# Patient Record
Sex: Female | Born: 1965 | Race: White | Hispanic: No | Marital: Married | Smoking: Never smoker
Health system: Southern US, Community
[De-identification: ages and names within clinical notes are randomized; demographics above are authoritative.]

## PROBLEM LIST (undated history)

## (undated) DIAGNOSIS — M1990 Unspecified osteoarthritis, unspecified site: Secondary | ICD-10-CM

## (undated) DIAGNOSIS — M2022 Hallux rigidus, left foot: Secondary | ICD-10-CM

## (undated) DIAGNOSIS — IMO0001 Reserved for inherently not codable concepts without codable children: Secondary | ICD-10-CM

## (undated) DIAGNOSIS — R42 Dizziness and giddiness: Secondary | ICD-10-CM

## (undated) DIAGNOSIS — T8859XA Other complications of anesthesia, initial encounter: Secondary | ICD-10-CM

## (undated) DIAGNOSIS — I10 Essential (primary) hypertension: Secondary | ICD-10-CM

## (undated) DIAGNOSIS — M719 Bursopathy, unspecified: Secondary | ICD-10-CM

## (undated) DIAGNOSIS — IMO0002 Reserved for concepts with insufficient information to code with codable children: Secondary | ICD-10-CM

## (undated) DIAGNOSIS — M5136 Other intervertebral disc degeneration, lumbar region: Secondary | ICD-10-CM

## (undated) DIAGNOSIS — F419 Anxiety disorder, unspecified: Secondary | ICD-10-CM

## (undated) DIAGNOSIS — E039 Hypothyroidism, unspecified: Secondary | ICD-10-CM

## (undated) DIAGNOSIS — J302 Other seasonal allergic rhinitis: Secondary | ICD-10-CM

## (undated) DIAGNOSIS — K219 Gastro-esophageal reflux disease without esophagitis: Secondary | ICD-10-CM

## (undated) DIAGNOSIS — E282 Polycystic ovarian syndrome: Secondary | ICD-10-CM

## (undated) DIAGNOSIS — R7303 Prediabetes: Secondary | ICD-10-CM

## (undated) DIAGNOSIS — E669 Obesity, unspecified: Secondary | ICD-10-CM

## (undated) DIAGNOSIS — M51369 Other intervertebral disc degeneration, lumbar region without mention of lumbar back pain or lower extremity pain: Secondary | ICD-10-CM

## (undated) DIAGNOSIS — T4145XA Adverse effect of unspecified anesthetic, initial encounter: Secondary | ICD-10-CM

## (undated) DIAGNOSIS — T753XXA Motion sickness, initial encounter: Secondary | ICD-10-CM

## (undated) HISTORY — PX: FACIAL COSMETIC SURGERY: SHX629

---

## 1988-02-19 HISTORY — PX: KNEE ARTHROSCOPY: SHX127

## 1996-02-19 HISTORY — PX: DIAGNOSTIC LAPAROSCOPY: SUR761

## 2005-05-06 ENCOUNTER — Ambulatory Visit: Payer: Self-pay | Admitting: Gynecology

## 2005-05-14 ENCOUNTER — Ambulatory Visit: Payer: Self-pay | Admitting: Family Medicine

## 2005-05-28 ENCOUNTER — Ambulatory Visit: Payer: Self-pay | Admitting: Family Medicine

## 2005-06-18 ENCOUNTER — Ambulatory Visit: Payer: Self-pay | Admitting: Family Medicine

## 2005-06-18 ENCOUNTER — Inpatient Hospital Stay (HOSPITAL_COMMUNITY): Admission: AD | Admit: 2005-06-18 | Discharge: 2005-06-18 | Payer: Self-pay | Admitting: Gynecology

## 2005-07-08 ENCOUNTER — Ambulatory Visit: Payer: Self-pay | Admitting: Gynecology

## 2005-11-25 ENCOUNTER — Other Ambulatory Visit: Admission: RE | Admit: 2005-11-25 | Discharge: 2005-11-25 | Payer: Self-pay | Admitting: Gynecology

## 2007-04-09 ENCOUNTER — Ambulatory Visit: Payer: Self-pay | Admitting: Gynecology

## 2007-10-21 ENCOUNTER — Ambulatory Visit: Payer: Self-pay | Admitting: Obstetrics and Gynecology

## 2010-07-03 NOTE — Assessment & Plan Note (Signed)
NAME:  Toni Wright, Toni Wright NO.:  [id number]   MEDICAL RECORD NO.:  [id number]          PATIENT TYPE:  POB   LOCATION:  [facility] at [facility]         FACILITY:  [facility]   PHYSICIAN:  Argentina Donovan, MD        DATE OF BIRTH:  09/01/1965   DATE OF SERVICE:  10/21/2007                                  CLINIC NOTE   The patient is a 45 year old Caucasian female gravida 2, para 1-0-1-1,  who is a long-time polycystic ovarian.  The patient has been on  Glucophage 1500 at bedtime for some time by her internist.  She  previously had been seen by an infertility specialist, who told her that  she needed more than Clomid.  We spent about 35-40 minutes discussing  her problem of infertility.  Her irregular periods, which have been the  most bothersome to her recently, where she will skip a month or two and  then have a very heavy period.  We have gone over in detail the process  of ovulation and discussed the temperature chart as well as what to look  for, how to take that, and how to use Glucophage and Clomid.  We have  changed her Glucophage to 3 times a day with meals at 500 mg nightly.  We are starting on Clomid 50 mg daily, day #5 through #9 of cycle.  If  she goes more than 30 days after the Clomid without a temperature rise  on her temperature chart, she is to come in.  If she does have a period,  then she is to continue this for 3 months, and then come in if she is  not pregnant at that time.  She has also been instructed to make notes  of days that she has coitus, so that we can evaluate the effectiveness  of the medicine and make sure that the correct day was yet.  The patient  weighs 300 pounds.  She has marked hirsutism, especially on the face,  and she has had polycystic ovarian syndrome for long term.  The plan is  as above.  We will have her return as previously noted and with her  temperature chart.   IMPRESSION:  Secondary infertility with polycystic ovarian  syndrome.           ______________________________  Argentina Donovan, MD     PR/MEDQ  D:  10/21/2007  T:  10/22/2007  Job:  [id number]

## 2010-07-06 NOTE — Group Therapy Note (Signed)
NAME:  Toni Wright, DUET NO.:  [id number]   MEDICAL RECORD NO.:  [id number]           PATIENT TYPE:   LOCATION:  [facility]                     FACILITY:   PHYSICIAN:  Tinnie Gens, MD        DATE OF BIRTH:  Nov 10, 1965   DATE OF SERVICE:                                    CLINIC NOTE   REAL TIME SCANNING:  The transvaginal probe was used on this patient.  An  intrauterine gestational sac plus yolk sac plus amnion are noted.  A fetal  pull is noted with a positive flicker.  Crown-rump length is approximately  5.8 mm.  Her gestational age is 6 weeks and 2 days.  The uterus overall size  is 8.3 x 6.7, with the cervix that is not included in that measurement of  3.5 cm.  The patient's right ovary is visualized and measures 2.5 x 1.7 cm.  The patient's left ovary measures 2.4 x 2.4 cm.  There are no other pelvic  abnormalities noted.  The images were saved in the patient's chart.           ______________________________  Tinnie Gens, MD     TP/MEDQ  D:  05/28/2005  T:  05/28/2005  Job:  [id number]

## 2011-06-25 ENCOUNTER — Other Ambulatory Visit: Payer: Self-pay | Admitting: Orthopedic Surgery

## 2011-06-26 ENCOUNTER — Encounter (HOSPITAL_BASED_OUTPATIENT_CLINIC_OR_DEPARTMENT_OTHER): Payer: Self-pay | Admitting: *Deleted

## 2011-06-26 NOTE — Progress Notes (Signed)
Will call [facility] for ekg and notes fro visit for chest pain-was r/o to be reflux Will need istat

## 2011-06-26 NOTE — Progress Notes (Signed)
pts pcp at [facility]

## 2011-06-27 NOTE — Progress Notes (Signed)
[facility] has no notes from pt since 1999 Not sure where pt went to r/o cp-to reflux- For ctr Will be sure anesth ok with that

## 2011-06-27 NOTE — Progress Notes (Signed)
Reviewed w/ dr Volanda Napoleon for ctr

## 2011-06-28 ENCOUNTER — Encounter (HOSPITAL_BASED_OUTPATIENT_CLINIC_OR_DEPARTMENT_OTHER): Payer: Self-pay | Admitting: Certified Registered Nurse Anesthetist

## 2011-06-28 ENCOUNTER — Other Ambulatory Visit: Payer: Self-pay

## 2011-06-28 ENCOUNTER — Encounter (HOSPITAL_BASED_OUTPATIENT_CLINIC_OR_DEPARTMENT_OTHER)
Admission: RE | Disposition: A | Discharge status: Home / Self Care | Payer: Self-pay | Source: Ambulatory Visit | Attending: Orthopedic Surgery

## 2011-06-28 ENCOUNTER — Ambulatory Visit (HOSPITAL_BASED_OUTPATIENT_CLINIC_OR_DEPARTMENT_OTHER): Payer: BC Managed Care – PPO | Admitting: Certified Registered Nurse Anesthetist

## 2011-06-28 ENCOUNTER — Ambulatory Visit (HOSPITAL_BASED_OUTPATIENT_CLINIC_OR_DEPARTMENT_OTHER)
Admission: RE | Admit: 2011-06-28 | Discharge: 2011-06-28 | Disposition: A | Discharge status: Home / Self Care | Payer: BC Managed Care – PPO | Source: Ambulatory Visit | Attending: Orthopedic Surgery | Admitting: Orthopedic Surgery

## 2011-06-28 DIAGNOSIS — E039 Hypothyroidism, unspecified: Secondary | ICD-10-CM | POA: Insufficient documentation

## 2011-06-28 DIAGNOSIS — E119 Type 2 diabetes mellitus without complications: Secondary | ICD-10-CM | POA: Insufficient documentation

## 2011-06-28 DIAGNOSIS — K219 Gastro-esophageal reflux disease without esophagitis: Secondary | ICD-10-CM | POA: Insufficient documentation

## 2011-06-28 DIAGNOSIS — E282 Polycystic ovarian syndrome: Secondary | ICD-10-CM | POA: Insufficient documentation

## 2011-06-28 DIAGNOSIS — M129 Arthropathy, unspecified: Secondary | ICD-10-CM | POA: Insufficient documentation

## 2011-06-28 DIAGNOSIS — G5600 Carpal tunnel syndrome, unspecified upper limb: Secondary | ICD-10-CM | POA: Insufficient documentation

## 2011-06-28 DIAGNOSIS — J45909 Unspecified asthma, uncomplicated: Secondary | ICD-10-CM | POA: Insufficient documentation

## 2011-06-28 HISTORY — DX: Polycystic ovarian syndrome: E28.2

## 2011-06-28 HISTORY — DX: Gastro-esophageal reflux disease without esophagitis: K21.9

## 2011-06-28 HISTORY — DX: Unspecified osteoarthritis, unspecified site: M19.90

## 2011-06-28 HISTORY — DX: Other seasonal allergic rhinitis: J30.2

## 2011-06-28 HISTORY — DX: Hypothyroidism, unspecified: E03.9

## 2011-06-28 HISTORY — PX: CARPAL TUNNEL RELEASE: SHX101

## 2011-06-28 LAB — POCT I-STAT, CHEM 8
BUN: 10 mg/dL (ref 6–23)
Calcium, Ion: 1.09 mmol/L — ABNORMAL LOW (ref 1.12–1.32)
Chloride: 106 mEq/L (ref 96–112)
Glucose, Bld: 121 mg/dL — ABNORMAL HIGH (ref 70–99)
TCO2: 24 mmol/L (ref 0–100)

## 2011-06-28 SURGERY — CARPAL TUNNEL RELEASE
Anesthesia: General | Site: Hand | Laterality: Right | Wound class: Clean

## 2011-06-28 MED ORDER — CHLORHEXIDINE GLUCONATE 4 % EX LIQD: 60.0000 mL | Freq: Once | CUTANEOUS | Status: DC

## 2011-06-28 MED ORDER — PROPOFOL 10 MG/ML IV EMUL
INTRAVENOUS | Status: DC | PRN
  Administered 2011-06-28: 260 mg via INTRAVENOUS

## 2011-06-28 MED ORDER — HYDROMORPHONE HCL PF 1 MG/ML IJ SOLN
0.2500 mg | INTRAMUSCULAR | Status: DC | PRN
  Administered 2011-06-28 (×2): 0.5 mg via INTRAVENOUS

## 2011-06-28 MED ORDER — FENTANYL CITRATE 0.05 MG/ML IJ SOLN: 50.0000 ug | INTRAMUSCULAR | Status: DC | PRN

## 2011-06-28 MED ORDER — MIDAZOLAM HCL 2 MG/2ML IJ SOLN: 1.0000 mg | INTRAMUSCULAR | Status: DC | PRN

## 2011-06-28 MED ORDER — OXYCODONE-ACETAMINOPHEN 5-325 MG PO TABS: 1.0000 | ORAL_TABLET | ORAL | Status: AC | PRN

## 2011-06-28 MED ORDER — FENTANYL CITRATE 0.05 MG/ML IJ SOLN
INTRAMUSCULAR | Status: DC | PRN
  Administered 2011-06-28: 50 ug via INTRAVENOUS

## 2011-06-28 MED ORDER — ONDANSETRON HCL 4 MG/2ML IJ SOLN
INTRAMUSCULAR | Status: DC | PRN
  Administered 2011-06-28: 4 mg via INTRAVENOUS

## 2011-06-28 MED ORDER — LIDOCAINE HCL (CARDIAC) 20 MG/ML IV SOLN
INTRAVENOUS | Status: DC | PRN
  Administered 2011-06-28: 30 mg via INTRAVENOUS

## 2011-06-28 MED ORDER — MIDAZOLAM HCL 5 MG/5ML IJ SOLN
INTRAMUSCULAR | Status: DC | PRN
  Administered 2011-06-28: 2 mg via INTRAVENOUS

## 2011-06-28 MED ORDER — LIDOCAINE HCL 2 % IJ SOLN
INTRAMUSCULAR | Status: DC | PRN
  Administered 2011-06-28: 3.5 mL

## 2011-06-28 MED ORDER — DEXAMETHASONE SODIUM PHOSPHATE 10 MG/ML IJ SOLN
INTRAMUSCULAR | Status: DC | PRN
  Administered 2011-06-28: 4 mg via INTRAVENOUS
  Administered 2011-06-28: 5 mg via INTRAVENOUS

## 2011-06-28 MED ORDER — LORAZEPAM 2 MG/ML IJ SOLN: 1.0000 mg | Freq: Once | INTRAMUSCULAR | Status: DC | PRN

## 2011-06-28 MED ORDER — LACTATED RINGERS IV SOLN
INTRAVENOUS | Status: DC
  Administered 2011-06-28: 09:00:00 via INTRAVENOUS
  Administered 2011-06-28: 20 mL/h via INTRAVENOUS

## 2011-06-28 SURGICAL SUPPLY — 38 items
BANDAGE ADHESIVE 1X3 (GAUZE/BANDAGES/DRESSINGS) IMPLANT
BANDAGE ELASTIC 3 VELCRO ST LF (GAUZE/BANDAGES/DRESSINGS) ×2 IMPLANT
BLADE SURG 15 STRL LF DISP TIS (BLADE) ×1 IMPLANT
BLADE SURG 15 STRL SS (BLADE) ×2
BNDG CMPR 9X4 STRL LF SNTH (GAUZE/BANDAGES/DRESSINGS) ×1
BNDG ESMARK 4X9 LF (GAUZE/BANDAGES/DRESSINGS) ×1 IMPLANT
BRUSH SCRUB EZ PLAIN DRY (MISCELLANEOUS) ×2 IMPLANT
CLOTH BEACON ORANGE TIMEOUT ST (SAFETY) ×2 IMPLANT
CORDS BIPOLAR (ELECTRODE) IMPLANT
COVER MAYO STAND STRL (DRAPES) ×2 IMPLANT
COVER TABLE BACK 60X90 (DRAPES) ×2 IMPLANT
CUFF TOURNIQUET SINGLE 18IN (TOURNIQUET CUFF) IMPLANT
CUFF TOURNIQUET SINGLE 24IN (TOURNIQUET CUFF) ×1 IMPLANT
DECANTER SPIKE VIAL GLASS SM (MISCELLANEOUS) IMPLANT
DRAPE EXTREMITY T 121X128X90 (DRAPE) ×2 IMPLANT
DRAPE SURG 17X23 STRL (DRAPES) ×2 IMPLANT
GLOVE BIO SURGEON STRL SZ 6.5 (GLOVE) ×1 IMPLANT
GLOVE BIOGEL M STRL SZ7.5 (GLOVE) ×2 IMPLANT
GLOVE ORTHO TXT STRL SZ7.5 (GLOVE) ×2 IMPLANT
GOWN PREVENTION PLUS XLARGE (GOWN DISPOSABLE) ×2 IMPLANT
GOWN PREVENTION PLUS XXLARGE (GOWN DISPOSABLE) ×4 IMPLANT
NEEDLE 27GAX1X1/2 (NEEDLE) ×1 IMPLANT
PACK BASIN DAY SURGERY FS (CUSTOM PROCEDURE TRAY) ×2 IMPLANT
PAD CAST 3X4 CTTN HI CHSV (CAST SUPPLIES) ×1 IMPLANT
PADDING CAST ABS 4INX4YD NS (CAST SUPPLIES) ×1
PADDING CAST ABS COTTON 4X4 ST (CAST SUPPLIES) ×1 IMPLANT
PADDING CAST COTTON 3X4 STRL (CAST SUPPLIES) ×2
SPLINT PLASTER CAST XFAST 3X15 (CAST SUPPLIES) ×5 IMPLANT
SPLINT PLASTER XTRA FASTSET 3X (CAST SUPPLIES) ×5
SPONGE GAUZE 4X4 12PLY (GAUZE/BANDAGES/DRESSINGS) ×2 IMPLANT
STOCKINETTE 4X48 STRL (DRAPES) ×2 IMPLANT
STRIP CLOSURE SKIN 1/2X4 (GAUZE/BANDAGES/DRESSINGS) ×2 IMPLANT
SUT PROLENE 3 0 PS 2 (SUTURE) ×2 IMPLANT
SYR 3ML 23GX1 SAFETY (SYRINGE) IMPLANT
SYR CONTROL 10ML LL (SYRINGE) ×1 IMPLANT
TRAY DSU PREP LF (CUSTOM PROCEDURE TRAY) ×2 IMPLANT
UNDERPAD 30X30 INCONTINENT (UNDERPADS AND DIAPERS) ×2 IMPLANT
WATER STERILE IRR 1000ML POUR (IV SOLUTION) ×2 IMPLANT

## 2011-06-28 NOTE — Anesthesia Postprocedure Evaluation (Signed)
  Anesthesia Post-op Note  Patient: Toni Wright  Procedure(s) Performed: Procedure(s) (LRB): CARPAL TUNNEL RELEASE (Right)  Patient Location: PACU  Anesthesia Type: General  Level of Consciousness: awake  Airway and Oxygen Therapy: Patient Spontanous Breathing  Post-op Pain: mild  Post-op Assessment: Post-op Vital signs reviewed, Patient's Cardiovascular Status Stable, Respiratory Function Stable, Patent Airway, No signs of Nausea or vomiting, Adequate PO intake and Pain level controlled  Post-op Vital Signs: stable  Complications: No apparent anesthesia complications

## 2011-06-28 NOTE — Discharge Instructions (Signed)

## 2011-06-28 NOTE — Anesthesia Preprocedure Evaluation (Addendum)
Anesthesia Evaluation  Patient identified by MRN, date of birth, ID band Patient awake    Reviewed: Allergy & Precautions, H&P , NPO status , Patient's Chart, lab work & pertinent test results  Airway Mallampati: III TM Distance: >3 FB Neck ROM: Full    Dental   Pulmonary asthma ,    Pulmonary exam normal       Cardiovascular     Neuro/Psych    GI/Hepatic GERD-  ,  Endo/Other  Diabetes mellitus-Hypothyroidism Morbid obesity  Renal/GU      Musculoskeletal   Abdominal (+) + obese,   Peds  Hematology   Anesthesia Other Findings   Reproductive/Obstetrics                          Anesthesia Physical Anesthesia Plan  ASA: III  Anesthesia Plan: General   Post-op Pain Management:    Induction: Intravenous  Airway Management Planned: LMA  Additional Equipment:   Intra-op Plan:   Post-operative Plan: Extubation in OR  Informed Consent: I have reviewed the patients History and Physical, chart, labs and discussed the procedure including the risks, benefits and alternatives for the proposed anesthesia with the patient or authorized representative who has indicated his/her understanding and acceptance.     Plan Discussed with: CRNA and Surgeon  Anesthesia Plan Comments:         Anesthesia Quick Evaluation

## 2011-06-28 NOTE — Anesthesia Postprocedure Evaluation (Signed)
  Anesthesia Post-op Note  Patient: Toni Wright  Procedure(s) Performed: Procedure(s) (LRB): CARPAL TUNNEL RELEASE (Right)  Patient Location: PACU  Anesthesia Type: General  Level of Consciousness: awake  Airway and Oxygen Therapy: Patient Spontanous Breathing  Post-op Pain: mild  Post-op Assessment: Post-op Vital signs reviewed, Patient's Cardiovascular Status Stable, Respiratory Function Stable, Patent Airway, No signs of Nausea or vomiting, Adequate PO intake and Pain level controlled  Post-op Vital Signs: stable  Complications: No apparent anesthesia complications 

## 2011-06-28 NOTE — Transfer of Care (Signed)
Immediate Anesthesia Transfer of Care Note  Patient: Toni Wright Numbers  Procedure(s) Performed: Procedure(s) (LRB): CARPAL TUNNEL RELEASE (Right)  Patient Location: PACU  Anesthesia Type: General  Level of Consciousness: awake, alert , oriented and patient cooperative  Airway & Oxygen Therapy: Patient Spontanous Breathing and Patient connected to face mask oxygen  Post-op Assessment: Report given to PACU RN and Post -op Vital signs reviewed and stable  Post vital signs: Reviewed and stable  Complications: No apparent anesthesia complications

## 2011-06-28 NOTE — Op Note (Signed)
[id number] 

## 2011-06-28 NOTE — Anesthesia Procedure Notes (Signed)
Procedure Name: LMA Insertion Date/Time: 06/28/2011 9:58 AM Performed by: Keyasia Jolliff D Pre-anesthesia Checklist: Patient identified, Emergency Drugs available, Suction available and Patient being monitored Patient Re-evaluated:Patient Re-evaluated prior to inductionOxygen Delivery Method: Circle System Utilized Preoxygenation: Pre-oxygenation with 100% oxygen Intubation Type: IV induction Ventilation: Mask ventilation without difficulty LMA: LMA inserted LMA Size: 4.0 Number of attempts: 1 Airway Equipment and Method: bite block Placement Confirmation: positive ETCO2 Tube secured with: Tape Dental Injury: Teeth and Oropharynx as per pre-operative assessment
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
## 2011-06-28 NOTE — Brief Op Note (Signed)
06/28/2011  10:49 AM  PATIENT:  Toni Wright  46 y.o. female  PRE-OPERATIVE DIAGNOSIS:  right carpal tunnel syndrome  POST-OPERATIVE DIAGNOSIS:  right carpal tunnel syndrome  PROCEDURE: CARPAL TUNNEL RELEASE (Right)  SURGEON:   Wyn Forster., MD   PHYSICIAN ASSISTANT:   ASSISTANTS: Mallory Shirk.A-C   ANESTHESIA:   general  EBL:  Total I/O In: 800 [I.V.:800] Out: -   BLOOD ADMINISTERED:none  DRAINS: none   LOCAL MEDICATIONS USED:  LIDOCAINE   SPECIMEN:  No Specimen  DISPOSITION OF SPECIMEN:  N/A  COUNTS:  YES  TOURNIQUET:   Total Tourniquet Time Documented: Upper Arm (Right) - 10 minutes  DICTATION: .Other Dictation: Dictation Number [id number]  PLAN OF CARE: Discharge to home after PACU  PATIENT DISPOSITION:  PACU - hemodynamically stable.

## 2011-07-01 ENCOUNTER — Encounter (HOSPITAL_BASED_OUTPATIENT_CLINIC_OR_DEPARTMENT_OTHER): Payer: Self-pay | Admitting: Orthopedic Surgery

## 2011-07-01 NOTE — Op Note (Signed)
[name], [name]                ACCOUNT NO.:  [id number]  MEDICAL RECORD NO.:  [id number]  LOCATION:                                 FACILITY:  PHYSICIAN:  Katy Fitch. Lucette Kratz, M.D.      DATE OF BIRTH:  DATE OF PROCEDURE:  06/28/2011 DATE OF DISCHARGE:                              OPERATIVE REPORT   PREOPERATIVE DIAGNOSIS:  Entrapment neuropathy, median nerve, right carpal tunnel.  POSTOPERATIVE DIAGNOSIS:  Entrapment neuropathy, median nerve, right carpal tunnel.  OPERATION:  Release of right transverse carpal ligament.  OPERATING SURGEON:  Katy Fitch. Tevon Berhane, M.D.  ASSISTANT:  Marveen Reeks. Dasnoit, PA-C.  ANESTHESIA:  General by LMA.  SUPERVISING ANESTHESIOLOGIST:  Bedelia Person, M.D.  INDICATIONS:  Toni Wright is a 46 year old woman who referred for evaluation and management of significant hand numbness and discomfort. Clinical examination revealed signs of significant carpal tunnel syndrome.  Electrodiagnostic studies confirmed same. Due to failure to respond to nonoperative measures, she was brought to the operating room at this time for release of right transverse carpal ligament.  PROCEDURE:  Toni Wright was brought to room 1 of the [facility] and placed in supine position on the operating table.  Following induction of general anesthesia by LMA technique under Dr. Burnett Corrente direct supervision, the right arm and hand were prepped with Betadine soap and solution, sterilely draped.  A pneumatic tourniquet was applied on proximal right brachium.  Following exsanguination of the right arm with Esmarch bandage, arterial tourniquet was inflated to 220 mmHg.  Following routine surgical time- out, the procedure commenced with a short incision in the line of the ring finger of the palm.  Subcutaneous tissues were carefully divided to reveal the palmar fascia.  This was split longitudinally to reveal the common sensory branch of the median nerve.  These were followed  back to the median nerve proper.  A Penfield elevator was used to create a pathway between the transverse carpal ligament and the median nerve and ulnar bursa.  Scissors were then used to release the ulnar aspect of the transverse carpal ligament.  A variant motor branch was noted to be penetrating the transcarpal ligament.  This was carefully protected.  The volar forearm fascia was released subcutaneously into the distal forearm.  Bleeding points along the margin of the released ligament were electrocauterized with bipolar forceps, followed by repair of the wound with intradermal 3-0 Prolene suture.  Compressive dressings applied with a volar plaster splint maintaining the wrist in 10 degrees of dorsiflexion.  For aftercare, Toni Wright is provided prescription for Percocet 5 mg 1 p.o. q.4-6 hours p.r.n. pain 20 tablets, without refill.  We will see her back for followup in our office in 7 days for dressing change, suture removal, and instruction on a home-based exercise program.     Katy Fitch. York Valliant, M.D.     RVS/MEDQ  D:  06/28/2011  T:  06/28/2011  Job:  [id number]

## 2012-06-30 ENCOUNTER — Ambulatory Visit: Payer: Self-pay | Admitting: Cardiology

## 2013-02-09 DIAGNOSIS — K219 Gastro-esophageal reflux disease without esophagitis: Secondary | ICD-10-CM | POA: Insufficient documentation

## 2013-02-09 DIAGNOSIS — I10 Essential (primary) hypertension: Secondary | ICD-10-CM | POA: Insufficient documentation

## 2013-06-08 ENCOUNTER — Other Ambulatory Visit: Payer: Self-pay | Admitting: Orthopedic Surgery

## 2013-06-10 ENCOUNTER — Encounter (HOSPITAL_BASED_OUTPATIENT_CLINIC_OR_DEPARTMENT_OTHER): Payer: Self-pay | Admitting: *Deleted

## 2013-06-10 NOTE — Progress Notes (Signed)
Pt was here 2013-did well-will need istat and ekg-
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
## 2013-06-15 ENCOUNTER — Encounter (HOSPITAL_BASED_OUTPATIENT_CLINIC_OR_DEPARTMENT_OTHER): Payer: Self-pay | Admitting: Anesthesiology

## 2013-06-15 ENCOUNTER — Ambulatory Visit (HOSPITAL_BASED_OUTPATIENT_CLINIC_OR_DEPARTMENT_OTHER): Payer: BC Managed Care – PPO | Admitting: Anesthesiology

## 2013-06-15 ENCOUNTER — Encounter (HOSPITAL_BASED_OUTPATIENT_CLINIC_OR_DEPARTMENT_OTHER)
Admission: RE | Disposition: A | Discharge status: Home / Self Care | Payer: Self-pay | Source: Ambulatory Visit | Attending: Orthopedic Surgery

## 2013-06-15 ENCOUNTER — Ambulatory Visit (HOSPITAL_BASED_OUTPATIENT_CLINIC_OR_DEPARTMENT_OTHER)
Admission: RE | Admit: 2013-06-15 | Discharge: 2013-06-15 | Disposition: A | Discharge status: Home / Self Care | Payer: BC Managed Care – PPO | Source: Ambulatory Visit | Attending: Orthopedic Surgery | Admitting: Orthopedic Surgery

## 2013-06-15 ENCOUNTER — Encounter (HOSPITAL_BASED_OUTPATIENT_CLINIC_OR_DEPARTMENT_OTHER): Payer: BC Managed Care – PPO | Admitting: Anesthesiology

## 2013-06-15 DIAGNOSIS — J45909 Unspecified asthma, uncomplicated: Secondary | ICD-10-CM | POA: Insufficient documentation

## 2013-06-15 DIAGNOSIS — M129 Arthropathy, unspecified: Secondary | ICD-10-CM | POA: Insufficient documentation

## 2013-06-15 DIAGNOSIS — K219 Gastro-esophageal reflux disease without esophagitis: Secondary | ICD-10-CM | POA: Insufficient documentation

## 2013-06-15 DIAGNOSIS — G5600 Carpal tunnel syndrome, unspecified upper limb: Secondary | ICD-10-CM | POA: Insufficient documentation

## 2013-06-15 DIAGNOSIS — E039 Hypothyroidism, unspecified: Secondary | ICD-10-CM | POA: Insufficient documentation

## 2013-06-15 DIAGNOSIS — Z6841 Body Mass Index (BMI) 40.0 and over, adult: Secondary | ICD-10-CM | POA: Insufficient documentation

## 2013-06-15 DIAGNOSIS — Z885 Allergy status to narcotic agent status: Secondary | ICD-10-CM | POA: Insufficient documentation

## 2013-06-15 DIAGNOSIS — IMO0002 Reserved for concepts with insufficient information to code with codable children: Secondary | ICD-10-CM | POA: Insufficient documentation

## 2013-06-15 HISTORY — DX: Reserved for concepts with insufficient information to code with codable children: IMO0002

## 2013-06-15 HISTORY — PX: CARPAL TUNNEL RELEASE: SHX101

## 2013-06-15 LAB — POCT I-STAT, CHEM 8
BUN: 12 mg/dL (ref 6–23)
Calcium, Ion: 1.17 mmol/L (ref 1.12–1.23)
Chloride: 104 mEq/L (ref 96–112)
Creatinine, Ser: 0.9 mg/dL (ref 0.50–1.10)
Glucose, Bld: 109 mg/dL — ABNORMAL HIGH (ref 70–99)
HEMATOCRIT: 39 % (ref 36.0–46.0)
HEMOGLOBIN: 13.3 g/dL (ref 12.0–15.0)
POTASSIUM: 3.6 meq/L — AB (ref 3.7–5.3)
Sodium: 141 mEq/L (ref 137–147)
TCO2: 25 mmol/L (ref 0–100)

## 2013-06-15 SURGERY — CARPAL TUNNEL RELEASE
Anesthesia: General | Site: Wrist | Laterality: Left

## 2013-06-15 MED ORDER — PROPOFOL 10 MG/ML IV BOLUS
INTRAVENOUS | Status: AC
  Filled 2013-06-15: qty 20

## 2013-06-15 MED ORDER — DEXAMETHASONE SODIUM PHOSPHATE 10 MG/ML IJ SOLN
INTRAMUSCULAR | Status: DC | PRN
  Administered 2013-06-15: 10 mg via INTRAVENOUS

## 2013-06-15 MED ORDER — LACTATED RINGERS IV SOLN
INTRAVENOUS | Status: DC
  Administered 2013-06-15: 07:00:00 via INTRAVENOUS

## 2013-06-15 MED ORDER — PROMETHAZINE HCL 25 MG/ML IJ SOLN: 6.2500 mg | INTRAMUSCULAR | Status: DC | PRN

## 2013-06-15 MED ORDER — MIDAZOLAM HCL 2 MG/2ML IJ SOLN
INTRAMUSCULAR | Status: AC
  Filled 2013-06-15: qty 2

## 2013-06-15 MED ORDER — TRAMADOL HCL 50 MG PO TABS: 50.0000 mg | ORAL_TABLET | Freq: Four times a day (QID) | ORAL | Status: DC | PRN

## 2013-06-15 MED ORDER — TRAMADOL HCL 50 MG PO TABS
ORAL_TABLET | ORAL | Status: AC
  Filled 2013-06-15: qty 1

## 2013-06-15 MED ORDER — ONDANSETRON HCL 4 MG/2ML IJ SOLN
INTRAMUSCULAR | Status: DC | PRN
  Administered 2013-06-15: 4 mg via INTRAVENOUS

## 2013-06-15 MED ORDER — LIDOCAINE HCL (CARDIAC) 20 MG/ML IV SOLN
INTRAVENOUS | Status: DC | PRN
  Administered 2013-06-15: 100 mg via INTRAVENOUS

## 2013-06-15 MED ORDER — FENTANYL CITRATE 0.05 MG/ML IJ SOLN
INTRAMUSCULAR | Status: DC | PRN
  Administered 2013-06-15: 100 ug via INTRAVENOUS

## 2013-06-15 MED ORDER — FENTANYL CITRATE 0.05 MG/ML IJ SOLN: 50.0000 ug | INTRAMUSCULAR | Status: DC | PRN

## 2013-06-15 MED ORDER — LIDOCAINE HCL 2 % IJ SOLN
INTRAMUSCULAR | Status: AC
  Filled 2013-06-15: qty 60

## 2013-06-15 MED ORDER — TRAMADOL HCL 50 MG PO TABS
50.0000 mg | ORAL_TABLET | Freq: Once | ORAL | Status: AC
  Administered 2013-06-15: 50 mg via ORAL

## 2013-06-15 MED ORDER — LIDOCAINE HCL 2 % IJ SOLN
INTRAMUSCULAR | Status: DC | PRN
  Administered 2013-06-15: 4 mL

## 2013-06-15 MED ORDER — HYDROMORPHONE HCL PF 1 MG/ML IJ SOLN: 0.2500 mg | INTRAMUSCULAR | Status: DC | PRN

## 2013-06-15 MED ORDER — MIDAZOLAM HCL 5 MG/5ML IJ SOLN
INTRAMUSCULAR | Status: DC | PRN
  Administered 2013-06-15: 2 mg via INTRAVENOUS

## 2013-06-15 MED ORDER — MIDAZOLAM HCL 2 MG/2ML IJ SOLN: 1.0000 mg | INTRAMUSCULAR | Status: DC | PRN

## 2013-06-15 MED ORDER — PROPOFOL 10 MG/ML IV BOLUS
INTRAVENOUS | Status: DC | PRN
  Administered 2013-06-15: 200 mg via INTRAVENOUS

## 2013-06-15 MED ORDER — CHLORHEXIDINE GLUCONATE 4 % EX LIQD: 60.0000 mL | Freq: Once | CUTANEOUS | Status: DC

## 2013-06-15 MED ORDER — FENTANYL CITRATE 0.05 MG/ML IJ SOLN
INTRAMUSCULAR | Status: AC
  Filled 2013-06-15: qty 4

## 2013-06-15 SURGICAL SUPPLY — 42 items
BANDAGE ADH SHEER 1  50/CT (GAUZE/BANDAGES/DRESSINGS) IMPLANT
BANDAGE ELASTIC 3 VELCRO ST LF (GAUZE/BANDAGES/DRESSINGS) ×3 IMPLANT
BLADE 15 SAFETY STRL DISP (BLADE) ×3 IMPLANT
BNDG CMPR 9X4 STRL LF SNTH (GAUZE/BANDAGES/DRESSINGS) ×1
BNDG COHESIVE 3X5 TAN STRL LF (GAUZE/BANDAGES/DRESSINGS) ×2 IMPLANT
BNDG ESMARK 4X9 LF (GAUZE/BANDAGES/DRESSINGS) ×2 IMPLANT
BRUSH SCRUB EZ PLAIN DRY (MISCELLANEOUS) ×3 IMPLANT
CLOSURE WOUND 1/2 X4 (GAUZE/BANDAGES/DRESSINGS) ×1
CORDS BIPOLAR (ELECTRODE) IMPLANT
COVER MAYO STAND STRL (DRAPES) ×3 IMPLANT
COVER TABLE BACK 60X90 (DRAPES) ×3 IMPLANT
CUFF TOURNIQUET SINGLE 18IN (TOURNIQUET CUFF) IMPLANT
CUFF TOURNIQUET SINGLE 24IN (TOURNIQUET CUFF) ×2 IMPLANT
DECANTER SPIKE VIAL GLASS SM (MISCELLANEOUS) IMPLANT
DRAPE EXTREMITY T 121X128X90 (DRAPE) ×3 IMPLANT
DRAPE SURG 17X23 STRL (DRAPES) ×3 IMPLANT
GAUZE SPONGE 4X4 12PLY STRL (GAUZE/BANDAGES/DRESSINGS) ×3 IMPLANT
GLOVE BIOGEL M STRL SZ7.5 (GLOVE) ×1 IMPLANT
GLOVE BIOGEL PI IND STRL 7.0 (GLOVE) IMPLANT
GLOVE BIOGEL PI INDICATOR 7.0 (GLOVE) ×2
GLOVE ECLIPSE 6.5 STRL STRAW (GLOVE) ×4 IMPLANT
GLOVE ORTHO TXT STRL SZ7.5 (GLOVE) ×3 IMPLANT
GOWN STRL REUS W/ TWL LRG LVL3 (GOWN DISPOSABLE) ×1 IMPLANT
GOWN STRL REUS W/ TWL XL LVL3 (GOWN DISPOSABLE) ×2 IMPLANT
GOWN STRL REUS W/TWL LRG LVL3 (GOWN DISPOSABLE) ×3
GOWN STRL REUS W/TWL XL LVL3 (GOWN DISPOSABLE) ×3
NEEDLE 27GAX1X1/2 (NEEDLE) ×2 IMPLANT
PACK BASIN DAY SURGERY FS (CUSTOM PROCEDURE TRAY) ×3 IMPLANT
PAD CAST 3X4 CTTN HI CHSV (CAST SUPPLIES) ×1 IMPLANT
PADDING CAST ABS 4INX4YD NS (CAST SUPPLIES)
PADDING CAST ABS COTTON 4X4 ST (CAST SUPPLIES) ×1 IMPLANT
PADDING CAST COTTON 3X4 STRL (CAST SUPPLIES) ×3
SPLINT PLASTER CAST XFAST 3X15 (CAST SUPPLIES) ×5 IMPLANT
SPLINT PLASTER XTRA FASTSET 3X (CAST SUPPLIES) ×10
STOCKINETTE 4X48 STRL (DRAPES) ×3 IMPLANT
STRIP CLOSURE SKIN 1/2X4 (GAUZE/BANDAGES/DRESSINGS) ×2 IMPLANT
SUT PROLENE 3 0 PS 2 (SUTURE) ×3 IMPLANT
SYR 3ML 23GX1 SAFETY (SYRINGE) IMPLANT
SYR CONTROL 10ML LL (SYRINGE) ×2 IMPLANT
TOWEL OR 17X24 6PK STRL BLUE (TOWEL DISPOSABLE) ×3 IMPLANT
TRAY DSU PREP LF (CUSTOM PROCEDURE TRAY) ×3 IMPLANT
UNDERPAD 30X30 INCONTINENT (UNDERPADS AND DIAPERS) ×3 IMPLANT

## 2013-06-15 NOTE — Transfer of Care (Signed)
Immediate Anesthesia Transfer of Care Note  Patient: Toni Wright  Procedure(s) Performed: Procedure(s): LEFT CARPAL TUNNEL RELEASE (Left)  Patient Location: PACU  Anesthesia Type:General  Level of Consciousness: awake, alert  and oriented  Airway & Oxygen Therapy: Patient Spontanous Breathing and Patient connected to face mask oxygen  Post-op Assessment: Report given to PACU RN and Post -op Vital signs reviewed and stable  Post vital signs: Reviewed and stable  Complications: No apparent anesthesia complications

## 2013-06-15 NOTE — Anesthesia Postprocedure Evaluation (Signed)
  Anesthesia Post-op Note  Patient: Toni Wright  Procedure(s) Performed: Procedure(s): LEFT CARPAL TUNNEL RELEASE (Left)  Patient Location: PACU  Anesthesia Type:General  Level of Consciousness: awake and alert   Airway and Oxygen Therapy: Patient Spontanous Breathing  Post-op Pain: mild  Post-op Assessment: Post-op Vital signs reviewed  Post-op Vital Signs: stable  Last Vitals:  Filed Vitals:   06/15/13 0845  BP: 110/72  Pulse: 80  Temp:   Resp: 18    Complications: No apparent anesthesia complications

## 2013-06-15 NOTE — Brief Op Note (Signed)
06/15/2013  8:09 AM  PATIENT:  Toni Wright  48 y.o. female  PRE-OPERATIVE DIAGNOSIS:  LEFT CARPAL TUNNEL SYNDROME  POST-OPERATIVE DIAGNOSIS:  left carpal tunnel syndrome   PROCEDURE:  Procedure(s): LEFT CARPAL TUNNEL RELEASE (Left)  SURGEON:  Surgeon(s) and Role:    * Wyn Forsterobert V Hulet Ehrmann Jr., MD - Primary  PHYSICIAN ASSISTANT:   ASSISTANTS: surgical technician   ANESTHESIA:   general  EBL:  Total I/O In: 500 [I.V.:500] Out: -   BLOOD ADMINISTERED:none  DRAINS: none   LOCAL MEDICATIONS USED:  LIDOCAINE   SPECIMEN:  No Specimen  DISPOSITION OF SPECIMEN:  N/A  COUNTS:  YES  TOURNIQUET:   Total Tourniquet Time Documented: Upper Arm (Left) - 10 minutes Total: Upper Arm (Left) - 10 minutes   DICTATION: .Other Dictation: Dictation Number [id number]  PLAN OF CARE: Discharge to home after PACU  PATIENT DISPOSITION:  PACU - hemodynamically stable.   Delay start of Pharmacological VTE agent (>24hrs) due to surgical blood loss or risk of bleeding: not applicable

## 2013-06-15 NOTE — Anesthesia Procedure Notes (Signed)
Procedure Name: LMA Insertion Date/Time: 06/15/2013 7:41 AM Performed by: Burna CashONRAD, Kweli Grassel C Pre-anesthesia Checklist: Patient identified, Emergency Drugs available, Suction available and Patient being monitored Patient Re-evaluated:Patient Re-evaluated prior to inductionOxygen Delivery Method: Circle System Utilized Preoxygenation: Pre-oxygenation with 100% oxygen Intubation Type: IV induction Ventilation: Mask ventilation without difficulty LMA: LMA inserted LMA Size: 4.0 Number of attempts: 1 Airway Equipment and Method: bite block Placement Confirmation: positive ETCO2 Tube secured with: Tape Dental Injury: Teeth and Oropharynx as per pre-operative assessment

## 2013-06-15 NOTE — Anesthesia Preprocedure Evaluation (Addendum)
Anesthesia Evaluation  Patient identified by MRN, date of birth, ID band Patient awake    Reviewed: Allergy & Precautions, H&P , NPO status , Patient's Chart, lab work & pertinent test results  History of Anesthesia Complications Negative for: history of anesthetic complications  Airway Mallampati: II  Neck ROM: Full    Dental  (+) Teeth Intact, Caps,    Pulmonary asthma ,  breath sounds clear to auscultation        Cardiovascular negative cardio ROS  Rhythm:Regular Rate:Normal     Neuro/Psych negative neurological ROS  negative psych ROS   GI/Hepatic GERD-  ,  Endo/Other  diabetesMorbid obesity  Renal/GU      Musculoskeletal   Abdominal (+) + obese,   Peds  Hematology   Anesthesia Other Findings   Reproductive/Obstetrics                         Anesthesia Physical Anesthesia Plan  ASA: II  Anesthesia Plan: General   Post-op Pain Management:    Induction: Intravenous  Airway Management Planned:   Additional Equipment:   Intra-op Plan:   Post-operative Plan: Extubation in OR  Informed Consent: I have reviewed the patients History and Physical, chart, labs and discussed the procedure including the risks, benefits and alternatives for the proposed anesthesia with the patient or authorized representative who has indicated his/her understanding and acceptance.   Dental advisory given  Plan Discussed with: CRNA and Surgeon  Anesthesia Plan Comments:         Anesthesia Quick Evaluation

## 2013-06-15 NOTE — Discharge Instructions (Signed)

## 2013-06-16 ENCOUNTER — Encounter (HOSPITAL_BASED_OUTPATIENT_CLINIC_OR_DEPARTMENT_OTHER): Payer: Self-pay | Admitting: Orthopedic Surgery

## 2013-06-16 NOTE — Op Note (Signed)
Toni Wright:  Harriss, Raaga                ACCOUNT NO.:  [id number]  MEDICAL RECORD NO.:  [id number]  LOCATION:                                 FACILITY:  PHYSICIAN:  Katy Fitchobert V. Prentice Sackrider, M.D.      DATE OF BIRTH:  DATE OF PROCEDURE:  06/15/2013 DATE OF DISCHARGE:                              OPERATIVE REPORT   PREOPERATIVE DIAGNOSIS:  Chronic left carpal tunnel syndrome.  POSTOPERATIVE DIAGNOSIS:  Chronic left carpal tunnel syndrome with identification of transligamentous motor branch carefully protected during release of transverse carpal ligament.  OPERATION:  Release of left transverse carpal ligament.  OPERATING SURGEON:  Katy Fitchobert V. Navah Grondin, MD  ASSISTANT:  Surgical technician.  ANESTHESIA:  General by LMA.  SUPERVISING ANESTHESIOLOGIST:  Burna FortsJames T. Massagee, MD  INDICATIONS:  Toni DameCandy Wright is a 48 year old woman who is well acquainted with our practice.  She is status post previous right carpal tunnel release surgery.  She has had an excellent result on the right side. During her right side surgery, she was noted to have a high risk transligamentous motor branch to the thenar musculature.  This was carefully protected during her initial right side surgery.  She now returns for similar surgery on the left.  After anesthesia, informed consent by Dr. Jacklynn BueMassagee in the holding area.  She was brought to room 2 of the [facility] for release of her left transverse carpal ligament.  Preoperatively, she was reminded potential risks and benefits of surgery.  Questions were invited and answered in detail.  Her left arm was marked per protocol with a marking pen in the holding area.  DESCRIPTION OF PROCEDURE:  Toni Wright was [facility] and placed in supine position on the operating table.  Following induction of general anesthesia by LMA technique under Dr. Marlane MingleMassagee's direct supervision, the left arm and hand were prepped with Betadine soap and  solution, sterilely draped.  A pneumatic tourniquet was applied to the proximal left brachium.  Following exsanguination of the left arm with Esmarch bandage, arterial tourniquet was inflated to 220 mmHg.  Following routine surgical time-out, procedure commenced with a 2.5 cm longitudinal incision in the line of the ring finger and the proximal palm. Subcutaneous tissues were carefully divided revealing the palmar fascia. This was meticulously split with scissors revealing the distal margin of the transverse carpal ligament.  A Penfield 4 elevator was used to carefully sound the carpal canal along its ulnar border.  Given the history of the transligamentous motor branch, we were quite careful in releasing the ulnar aspect of the transverse carpal ligament.  We released the ligament sequentially with scissors along its ulnar border and once again, identified a high risk transligamentous motor branch that was exiting the median nerve on its volar aspect, ulnar to the midline of the nerve.  This is a very high risk of position of the motor branch.  The motor branch was carefully protected during release of the complete transverse carpal ligament as well as the volar forearm fascia.  The motor branch was followed into the thenar muscles and found to be unimpeded.  The wound was inspected for  bleeding points, subsequently repaired with intradermal 3-0 Prolene.  The ulnar bursa was quite thickened and fibrotic.  The median nerve was otherwise unimpeded.  The wound was then dressed with Steri-Strips, sterile gauze, sterile Webril, and a volar plaster splint maintaining the wrist in 15 degrees of dorsiflexion.  A 2% lidocaine, total volume of 4 mL was infiltrated along the wound margins for postoperative comfort.  Ms. [name] was awakened from general anesthesia, transferred to the recovery room with stable vital signs.  She will be discharged home with prescription for tramadol 1-2  tablets p.o. q.4-6 hours p.r.n. pain, 30 tablets without refill.  She was advised to use Aleve 2 tablets in the morning, 2 tablets in the evening with meals.  We will see her back in followup in the office in 7-8 days for dressing change, suture removal, and advancement to a postoperative rehab program.     Katy Fitchobert V. Salaya Holtrop, M.D.     RVS/MEDQ  D:  06/15/2013  T:  06/16/2013  Job:  [id number]

## 2013-06-22 ENCOUNTER — Ambulatory Visit: Payer: Self-pay | Admitting: Gastroenterology

## 2013-06-22 HISTORY — PX: COLONOSCOPY: SHX174

## 2013-06-23 LAB — PATHOLOGY REPORT

## 2013-11-15 ENCOUNTER — Ambulatory Visit: Payer: Self-pay | Admitting: Family Medicine

## 2013-11-18 ENCOUNTER — Ambulatory Visit: Payer: Self-pay | Admitting: Family Medicine

## 2014-04-17 ENCOUNTER — Emergency Department: Payer: Self-pay | Admitting: Emergency Medicine

## 2015-01-16 NOTE — Discharge Instructions (Signed)
[facility] °[facility] ° °POST OPERATIVE INSTRUCTIONS FOR DR. TROXLER AND DR. FOWLER °[facility] ° ° °1. Take your medication as prescribed.  Pain medication should be taken only as needed. ° °2. Keep the dressing clean, dry and intact. ° °3. Keep your foot elevated above the heart level for the first 48 hours. ° °4. Walking to the bathroom and brief periods of walking are acceptable, unless we have instructed you to be non-weight bearing. ° °5. Always wear your post-op shoe when walking.  Always use your crutches if you are to be non-weight bearing. ° °6. Do not take a shower. Baths are permissible as long as the foot is kept out of the water.  ° °7. Every hour you are awake:  °- Bend your knee 15 times. °- Flex foot 15 times °- Massage calf 15 times ° °8. Call [facility] ([phone]) if any of the following problems occur: °- You develop a temperature or fever. °- The bandage becomes saturated with blood. °- Medication does not stop your pain. °- Injury of the foot occurs. °- Any symptoms of infection including redness, odor, or red streaks running from wound. ° °General Anesthesia, Adult, Care After °Refer to this sheet in the next few weeks. These instructions provide you with information on caring for yourself after your procedure. Your health care provider may also give you more specific instructions. Your treatment has been planned according to current medical practices, but problems sometimes occur. Call your health care provider if you have any problems or questions after your procedure. °WHAT TO EXPECT AFTER THE PROCEDURE °After the procedure, it is typical to experience: °· Sleepiness. °· Nausea and vomiting. °HOME CARE INSTRUCTIONS °· For the first 24 hours after general anesthesia: °¨ Have a responsible person with you. °¨ Do not drive a car. If you are alone, do not take public transportation. °¨ Do not drink alcohol. °¨ Do not take  medicine that has not been prescribed by your health care provider. °¨ Do not sign important papers or make important decisions. °¨ You may resume a normal diet and activities as directed by your health care provider. °· Change bandages (dressings) as directed. °· If you have questions or problems that seem related to general anesthesia, call the hospital and ask for the anesthetist or anesthesiologist on call. °SEEK MEDICAL CARE IF: °· You have nausea and vomiting that continue the day after anesthesia. °· You develop a rash. °SEEK IMMEDIATE MEDICAL CARE IF:  °· You have difficulty breathing. °· You have chest pain. °· You have any allergic problems. °  °This information is not intended to replace advice given to you by your health care provider. Make sure you discuss any questions you have with your health care provider. °  °Document Released: 05/13/2000 Document Revised: 02/25/2014 Document Reviewed: 06/05/2011 °Elsevier Interactive Patient Education ©2016 [employer] ° °

## 2015-01-18 ENCOUNTER — Ambulatory Visit: Payer: BLUE CROSS/BLUE SHIELD | Admitting: Anesthesiology

## 2015-01-18 ENCOUNTER — Ambulatory Visit
Admission: RE | Admit: 2015-01-18 | Discharge: 2015-01-18 | Disposition: A | Discharge status: Home / Self Care | Payer: BLUE CROSS/BLUE SHIELD | Source: Ambulatory Visit | Attending: Podiatry | Admitting: Podiatry

## 2015-01-18 ENCOUNTER — Encounter: Payer: Self-pay | Admitting: *Deleted

## 2015-01-18 ENCOUNTER — Encounter
Admission: RE | Disposition: A | Discharge status: Home / Self Care | Payer: Self-pay | Source: Ambulatory Visit | Attending: Podiatry

## 2015-01-18 DIAGNOSIS — Z79899 Other long term (current) drug therapy: Secondary | ICD-10-CM | POA: Diagnosis not present

## 2015-01-18 DIAGNOSIS — K219 Gastro-esophageal reflux disease without esophagitis: Secondary | ICD-10-CM | POA: Insufficient documentation

## 2015-01-18 DIAGNOSIS — M2022 Hallux rigidus, left foot: Secondary | ICD-10-CM | POA: Insufficient documentation

## 2015-01-18 DIAGNOSIS — J309 Allergic rhinitis, unspecified: Secondary | ICD-10-CM | POA: Insufficient documentation

## 2015-01-18 DIAGNOSIS — Z885 Allergy status to narcotic agent status: Secondary | ICD-10-CM | POA: Insufficient documentation

## 2015-01-18 DIAGNOSIS — Z7951 Long term (current) use of inhaled steroids: Secondary | ICD-10-CM | POA: Diagnosis not present

## 2015-01-18 DIAGNOSIS — E039 Hypothyroidism, unspecified: Secondary | ICD-10-CM | POA: Diagnosis not present

## 2015-01-18 DIAGNOSIS — M25572 Pain in left ankle and joints of left foot: Secondary | ICD-10-CM | POA: Diagnosis not present

## 2015-01-18 DIAGNOSIS — E282 Polycystic ovarian syndrome: Secondary | ICD-10-CM | POA: Diagnosis not present

## 2015-01-18 DIAGNOSIS — J45909 Unspecified asthma, uncomplicated: Secondary | ICD-10-CM | POA: Diagnosis not present

## 2015-01-18 DIAGNOSIS — I119 Hypertensive heart disease without heart failure: Secondary | ICD-10-CM | POA: Diagnosis not present

## 2015-01-18 DIAGNOSIS — I2510 Atherosclerotic heart disease of native coronary artery without angina pectoris: Secondary | ICD-10-CM | POA: Diagnosis not present

## 2015-01-18 DIAGNOSIS — G8929 Other chronic pain: Secondary | ICD-10-CM | POA: Diagnosis not present

## 2015-01-18 DIAGNOSIS — E785 Hyperlipidemia, unspecified: Secondary | ICD-10-CM | POA: Diagnosis not present

## 2015-01-18 DIAGNOSIS — Z6841 Body Mass Index (BMI) 40.0 and over, adult: Secondary | ICD-10-CM | POA: Diagnosis not present

## 2015-01-18 DIAGNOSIS — Z791 Long term (current) use of non-steroidal anti-inflammatories (NSAID): Secondary | ICD-10-CM | POA: Diagnosis not present

## 2015-01-18 DIAGNOSIS — M1991 Primary osteoarthritis, unspecified site: Secondary | ICD-10-CM | POA: Insufficient documentation

## 2015-01-18 DIAGNOSIS — Z883 Allergy status to other anti-infective agents status: Secondary | ICD-10-CM | POA: Insufficient documentation

## 2015-01-18 DIAGNOSIS — Z9889 Other specified postprocedural states: Secondary | ICD-10-CM | POA: Insufficient documentation

## 2015-01-18 HISTORY — DX: Hallux rigidus, left foot: M20.22

## 2015-01-18 HISTORY — DX: Obesity, unspecified: E66.9

## 2015-01-18 HISTORY — DX: Anxiety disorder, unspecified: F41.9

## 2015-01-18 HISTORY — DX: Adverse effect of unspecified anesthetic, initial encounter: T41.45XA

## 2015-01-18 HISTORY — DX: Bursopathy, unspecified: M71.9

## 2015-01-18 HISTORY — DX: Other intervertebral disc degeneration, lumbar region: M51.36

## 2015-01-18 HISTORY — DX: Dizziness and giddiness: R42

## 2015-01-18 HISTORY — PX: CHEILECTOMY: SHX1336

## 2015-01-18 HISTORY — DX: Essential (primary) hypertension: I10

## 2015-01-18 HISTORY — DX: Motion sickness, initial encounter: T75.3XXA

## 2015-01-18 HISTORY — DX: Other intervertebral disc degeneration, lumbar region without mention of lumbar back pain or lower extremity pain: M51.369

## 2015-01-18 HISTORY — DX: Other complications of anesthesia, initial encounter: T88.59XA

## 2015-01-18 HISTORY — DX: Reserved for inherently not codable concepts without codable children: IMO0001

## 2015-01-18 SURGERY — CHEILECTOMY
Anesthesia: General | Laterality: Left | Wound class: Clean

## 2015-01-18 MED ORDER — DEXAMETHASONE SODIUM PHOSPHATE 4 MG/ML IJ SOLN
INTRAMUSCULAR | Status: DC | PRN
  Administered 2015-01-18: 8 mg via INTRAVENOUS

## 2015-01-18 MED ORDER — PROMETHAZINE HCL 25 MG/ML IJ SOLN
6.2500 mg | Freq: Once | INTRAMUSCULAR | Status: AC
  Administered 2015-01-18: 6.25 mg via INTRAVENOUS

## 2015-01-18 MED ORDER — MIDAZOLAM HCL 5 MG/5ML IJ SOLN
INTRAMUSCULAR | Status: DC | PRN
  Administered 2015-01-18: 2 mg via INTRAVENOUS

## 2015-01-18 MED ORDER — FENTANYL CITRATE (PF) 100 MCG/2ML IJ SOLN
INTRAMUSCULAR | Status: DC | PRN
  Administered 2015-01-18 (×2): 50 ug via INTRAVENOUS

## 2015-01-18 MED ORDER — KETOROLAC TROMETHAMINE 30 MG/ML IJ SOLN
30.0000 mg | Freq: Once | INTRAMUSCULAR | Status: AC | PRN
  Administered 2015-01-18: 30 mg via INTRAVENOUS

## 2015-01-18 MED ORDER — LIDOCAINE-EPINEPHRINE 1 %-1:100000 IJ SOLN
INTRAMUSCULAR | Status: DC | PRN
  Administered 2015-01-18: 4 mL

## 2015-01-18 MED ORDER — OXYCODONE HCL 5 MG PO TABS
5.0000 mg | ORAL_TABLET | Freq: Once | ORAL | Status: AC | PRN
  Administered 2015-01-18: 5 mg via ORAL

## 2015-01-18 MED ORDER — ONDANSETRON HCL 4 MG/2ML IJ SOLN
INTRAMUSCULAR | Status: DC | PRN
  Administered 2015-01-18: 4 mg via INTRAVENOUS

## 2015-01-18 MED ORDER — CEFAZOLIN SODIUM-DEXTROSE 2-3 GM-% IV SOLR
2.0000 g | Freq: Once | INTRAVENOUS | Status: AC
  Administered 2015-01-18: 2 g via INTRAVENOUS

## 2015-01-18 MED ORDER — HYDROMORPHONE HCL 1 MG/ML IJ SOLN
0.2500 mg | INTRAMUSCULAR | Status: DC | PRN
  Administered 2015-01-18: 0.4 mg via INTRAVENOUS

## 2015-01-18 MED ORDER — OXYCODONE-ACETAMINOPHEN 7.5-325 MG PO TABS: 1.0000 | ORAL_TABLET | ORAL | Status: DC | PRN

## 2015-01-18 MED ORDER — PROPOFOL 10 MG/ML IV BOLUS
INTRAVENOUS | Status: DC | PRN
  Administered 2015-01-18: 200 mg via INTRAVENOUS

## 2015-01-18 MED ORDER — ONDANSETRON HCL 4 MG/2ML IJ SOLN
4.0000 mg | Freq: Once | INTRAMUSCULAR | Status: AC | PRN
  Administered 2015-01-18: 4 mg via INTRAVENOUS

## 2015-01-18 MED ORDER — LACTATED RINGERS IV SOLN
INTRAVENOUS | Status: DC
  Administered 2015-01-18: 11:00:00 via INTRAVENOUS

## 2015-01-18 MED ORDER — LIDOCAINE HCL (CARDIAC) 20 MG/ML IV SOLN
INTRAVENOUS | Status: DC | PRN
  Administered 2015-01-18: 40 mg via INTRATRACHEAL

## 2015-01-18 MED ORDER — BUPIVACAINE HCL (PF) 0.5 % IJ SOLN
INTRAMUSCULAR | Status: DC | PRN
  Administered 2015-01-18: 4 mL

## 2015-01-18 MED ORDER — OXYCODONE HCL 5 MG/5ML PO SOLN: 5.0000 mg | Freq: Once | ORAL | Status: AC | PRN

## 2015-01-18 SURGICAL SUPPLY — 70 items
APL SKNCLS STERI-STRIP NONHPOA (GAUZE/BANDAGES/DRESSINGS) ×1
BANDAGE CONFORM 2X5YD N/S (GAUZE/BANDAGES/DRESSINGS) ×2 IMPLANT
BANDAGE ELASTIC 4 CLIP NS LF (GAUZE/BANDAGES/DRESSINGS) ×3 IMPLANT
BENZOIN TINCTURE PRP APPL 2/3 (GAUZE/BANDAGES/DRESSINGS) ×3 IMPLANT
BLADE CRESCENTIC (BLADE) IMPLANT
BLADE MED AGGRESSIVE (BLADE) IMPLANT
BLADE MINI RND TIP GREEN BEAV (BLADE) IMPLANT
BLADE OSC/SAGITTAL 5.5X25 (BLADE) IMPLANT
BLADE OSC/SAGITTAL MD 5.5X18 (BLADE) IMPLANT
BLADE OSC/SAGITTAL MD 9X18.5 (BLADE) ×2 IMPLANT
BLADE OSCILLATING/SAGITTAL (BLADE)
BLADE SW THK.38XMED LNG THN (BLADE) IMPLANT
BNDG CMPR 75X41 PLY HI ABS (GAUZE/BANDAGES/DRESSINGS) ×1
BNDG ESMARK 4X12 TAN STRL LF (GAUZE/BANDAGES/DRESSINGS) ×3 IMPLANT
BNDG GAUZE 4.5X4.1 6PLY STRL (MISCELLANEOUS) ×3 IMPLANT
BNDG STRETCH 4X75 STRL LF (GAUZE/BANDAGES/DRESSINGS) ×3 IMPLANT
BUR EGG 4X8 MED (BURR) IMPLANT
BUR STRYKR EGG 5.0 (BURR) IMPLANT
CANISTER SUCT 1200ML W/VALVE (MISCELLANEOUS) ×3 IMPLANT
CAST PADDING 3X4FT ST 30246 (SOFTGOODS)
CLOSURE WOUND 1/4X4 (GAUZE/BANDAGES/DRESSINGS) ×1
COVER LIGHT HANDLE UNIVERSAL (MISCELLANEOUS) ×6 IMPLANT
COVER PIN YLW 0.028-062 (MISCELLANEOUS) IMPLANT
CUFF TOURN SGL QUICK 18 (TOURNIQUET CUFF) IMPLANT
DRAPE FLUOR MINI C-ARM 54X84 (DRAPES) ×3 IMPLANT
DRILL WIRE PASS (DRILL) IMPLANT
DURAPREP 26ML APPLICATOR (WOUND CARE) ×3 IMPLANT
GAUZE PETRO XEROFOAM 1X8 (MISCELLANEOUS) ×3 IMPLANT
GAUZE SPONGE 4X4 12PLY STRL (GAUZE/BANDAGES/DRESSINGS) ×3 IMPLANT
GLOVE BIO SURGEON STRL SZ8 (GLOVE) ×3 IMPLANT
GOWN STRL REUS W/ TWL LRG LVL3 (GOWN DISPOSABLE) ×1 IMPLANT
GOWN STRL REUS W/ TWL XL LVL3 (GOWN DISPOSABLE) ×1 IMPLANT
GOWN STRL REUS W/TWL LRG LVL3 (GOWN DISPOSABLE) ×3
GOWN STRL REUS W/TWL XL LVL3 (GOWN DISPOSABLE) ×3
K-WIRE DBL END TROCAR 6X.045 (WIRE)
K-WIRE DBL END TROCAR 6X.062 (WIRE)
KIT ROOM TURNOVER OR (KITS) ×3 IMPLANT
KWIRE DBL END TROCAR 6X.045 (WIRE) IMPLANT
KWIRE DBL END TROCAR 6X.062 (WIRE) IMPLANT
NDL HYPO 18GX1.5 BLUNT FILL (NEEDLE) IMPLANT
NDL HYPO 25GX1X1/2 BEV (NEEDLE) IMPLANT
NEEDLE HYPO 18GX1.5 BLUNT FILL (NEEDLE) IMPLANT
NEEDLE HYPO 25GX1X1/2 BEV (NEEDLE) IMPLANT
NS IRRIG 500ML POUR BTL (IV SOLUTION) ×3 IMPLANT
PACK EXTREMITY ARMC (MISCELLANEOUS) ×3 IMPLANT
PAD CAST CTTN 3X4 STRL (SOFTGOODS) IMPLANT
PAD GROUND ADULT SPLIT (MISCELLANEOUS) ×3 IMPLANT
PADDING CAST COTTON 3X4 STRL (SOFTGOODS)
RASP SM TEAR CROSS CUT (RASP) ×5 IMPLANT
SPLINT CAST 1 STEP 4X30 (MISCELLANEOUS) ×3 IMPLANT
SPLINT FAST PLASTER 5X30 (CAST SUPPLIES)
SPLINT PLASTER CAST FAST 5X30 (CAST SUPPLIES) IMPLANT
STOCKINETTE STRL 6IN 960660 (GAUZE/BANDAGES/DRESSINGS) ×3 IMPLANT
STRAP BODY AND KNEE 60X3 (MISCELLANEOUS) ×3 IMPLANT
STRIP CLOSURE SKIN 1/4X4 (GAUZE/BANDAGES/DRESSINGS) ×2 IMPLANT
SUT ETHILON 4-0 (SUTURE)
SUT ETHILON 4-0 FS2 18XMFL BLK (SUTURE)
SUT ETHILON 5-0 FS-2 18 BLK (SUTURE) ×1 IMPLANT
SUT VIC AB 1 CT1 36 (SUTURE) IMPLANT
SUT VIC AB 2-0 CT1 27 (SUTURE)
SUT VIC AB 2-0 CT1 TAPERPNT 27 (SUTURE) IMPLANT
SUT VIC AB 2-0 SH 27 (SUTURE)
SUT VIC AB 2-0 SH 27XBRD (SUTURE) IMPLANT
SUT VIC AB 3-0 SH 27 (SUTURE) ×3
SUT VIC AB 3-0 SH 27X BRD (SUTURE) IMPLANT
SUT VIC AB 4-0 FS2 27 (SUTURE) ×2 IMPLANT
SUT VICRYL AB 3-0 FS1 BRD 27IN (SUTURE) IMPLANT
SUTURE ETHLN 4-0 FS2 18XMF BLK (SUTURE) ×1 IMPLANT
SYRINGE 10CC LL (SYRINGE) IMPLANT
WIRE K .045 DBL END (WIRE) ×2 IMPLANT

## 2015-01-18 NOTE — Anesthesia Postprocedure Evaluation (Signed)
Anesthesia Post Note  Patient: Toni Wright  Procedure(s) Performed: Procedure(s) (LRB): CHEILECTOMY LEFT FOOT (Left)  Patient location during evaluation: PACU Anesthesia Type: General Level of consciousness: awake and alert Pain management: pain level controlled Vital Signs Assessment: post-procedure vital signs reviewed and stable Respiratory status: spontaneous breathing, nonlabored ventilation, respiratory function stable and patient connected to nasal cannula oxygen Cardiovascular status: blood pressure returned to baseline and stable Postop Assessment: no signs of nausea or vomiting Anesthetic complications: no    Durene Fruitshomas,  Siedah Sedor G

## 2015-01-18 NOTE — H&P (Signed)
H and P has been reviewed and no changes are noted.  

## 2015-01-18 NOTE — Anesthesia Preprocedure Evaluation (Signed)
Anesthesia Evaluation  Patient identified by MRN, date of birth, ID band Patient awake    Reviewed: H&P , NPO status , Patient's Chart, lab work & pertinent test results  Airway Mallampati: II  TM Distance: >3 FB Neck ROM: full    Dental   Pulmonary shortness of breath, asthma ,    Pulmonary exam normal        Cardiovascular hypertension, Normal cardiovascular exam     Neuro/Psych PSYCHIATRIC DISORDERS    GI/Hepatic GERD  ,  Endo/Other  Hypothyroidism Morbid obesityPCOS  Renal/GU      Musculoskeletal   Abdominal   Peds  Hematology   Anesthesia Other Findings   Reproductive/Obstetrics                             Anesthesia Physical Anesthesia Plan  ASA: III  Anesthesia Plan: General LMA   Post-op Pain Management:    Induction:   Airway Management Planned:   Additional Equipment:   Intra-op Plan:   Post-operative Plan:   Informed Consent: I have reviewed the patients History and Physical, chart, labs and discussed the procedure including the risks, benefits and alternatives for the proposed anesthesia with the patient or authorized representative who has indicated his/her understanding and acceptance.     Plan Discussed with: CRNA  Anesthesia Plan Comments:         Anesthesia Quick Evaluation

## 2015-01-18 NOTE — Op Note (Signed)
Operative note   Surgeon: Dr. Recardo EvangelistMatthew Kaled Allende, DPM.    Assistant: None    Preop diagnosis: Hallux rigidus left foot    Postop diagnosis: Same    Procedure:   1. Hallux rigidus correction with cheilectomy left first metatarsophalangeal joint          EBL: Less than 10 cc    Anesthesia:general with local block    Hemostasis: Ankle tourniquet 250 mmHg pressure    Specimen: Degenerative bone and cartilage with osteophytes from the left first metatarsophalangeal joint    Complications: None    Operative indications: Chronic pain and arthritic changes to the left great toe joint resistant to conservative care    Procedure:  Patient was brought into the OR and placed on the operating table in thesupine position. After anesthesia was obtained theleft lower extremity was prepped and draped in usual sterile fashion.  Operative Report: At this time attention directed to the dorsum of the left first metatarsophalangeal joint where a 4 cm linear incision was made and deepened sharp blunt dissection. Bleeders clamped and bovied as required. Tissue was identified and incised longitudinally reflected mediolaterally from the first metatarsophalangeal joint. This time osteophyte formation was identified and removed from the joint area. A large bony spur from the dorsal aspect of the first metatarsal was then resected and rasped smoothly. Other bony proliferation along the proximal phalanx dorsally mediolaterally was resected with rongeurs and also rasped smoothly as well as the metatarsal head. There is checked FluoroScan good reduction of the bony prominence dorsally as well as the proximal phalanx proliferation was noted. No further spurs or osteophytes were noted. After She irrigation a 3-0 Vicryl was used to interpose some of the capsule tissue in between the base proximal phalanx and the first metatarsal head. Remaining Tissue was enclosed with 3-0 Vicryl in a continuous stitch. Deep superficial  fascial layers closed with 4 Vicryl in continuous stitch. Skin closed with 4 Vicryl subcuticular stitch.  At this time there is block 0.5% Marcaine plain and a sterile compressive dressing was placed across the region consisting of 0 gauze Steri-Strips 4 x 4's Kling Kerlix. The tourniquet was released and prompt complete vascularity seen to return all digits of the left foot    Patient tolerated the procedure and anesthesia well.  Was transported from the OR to the PACU with all vital signs stable and vascular status intact. To be discharged per routine protocol.  Will follow up in approximately 1 week in the outpatient clinic.

## 2015-01-18 NOTE — Transfer of Care (Signed)
Immediate Anesthesia Transfer of Care Note  Patient: Yetta Numbersandy H Engh  Procedure(s) Performed: Procedure(s) with comments: CHEILECTOMY LEFT FOOT (Left) - LMA WITH LOCAL/ UPREG  Patient Location: PACU  Anesthesia Type: General LMA  Level of Consciousness: awake, alert  and patient cooperative  Airway and Oxygen Therapy: Patient Spontanous Breathing and Patient connected to supplemental oxygen  Post-op Assessment: Post-op Vital signs reviewed, Patient's Cardiovascular Status Stable, Respiratory Function Stable, Patent Airway and No signs of Nausea or vomiting  Post-op Vital Signs: Reviewed and stable  Complications: No apparent anesthesia complications

## 2015-01-18 NOTE — Anesthesia Procedure Notes (Signed)
Procedure Name: LMA Insertion Date/Time: 01/18/2015 12:21 PM Performed by: Andee PolesBUSH, Toni Wright Pre-anesthesia Checklist: Patient identified, Emergency Drugs available, Suction available, Timeout performed and Patient being monitored Patient Re-evaluated:Patient Re-evaluated prior to inductionOxygen Delivery Method: Circle system utilized Preoxygenation: Pre-oxygenation with 100% oxygen Intubation Type: IV induction LMA: LMA inserted LMA Size: 4.0 Number of attempts: 1 Placement Confirmation: positive ETCO2 and breath sounds checked- equal and bilateral Tube secured with: Tape

## 2015-01-19 ENCOUNTER — Encounter: Payer: Self-pay | Admitting: Podiatry

## 2015-01-20 LAB — SURGICAL PATHOLOGY

## 2015-02-03 NOTE — H&P (Signed)
H and P has been reviewed and no changes are noted.  

## 2015-04-10 ENCOUNTER — Other Ambulatory Visit: Payer: Self-pay | Admitting: Physical Medicine and Rehabilitation

## 2015-04-10 DIAGNOSIS — M7061 Trochanteric bursitis, right hip: Secondary | ICD-10-CM

## 2015-04-10 DIAGNOSIS — M25551 Pain in right hip: Secondary | ICD-10-CM

## 2015-04-17 ENCOUNTER — Other Ambulatory Visit: Payer: BLUE CROSS/BLUE SHIELD

## 2015-04-17 ENCOUNTER — Ambulatory Visit
Admission: RE | Admit: 2015-04-17 | Discharge: 2015-04-17 | Disposition: A | Discharge status: Home / Self Care | Payer: BLUE CROSS/BLUE SHIELD | Source: Ambulatory Visit | Attending: Physical Medicine and Rehabilitation | Admitting: Physical Medicine and Rehabilitation

## 2015-04-17 DIAGNOSIS — M25551 Pain in right hip: Secondary | ICD-10-CM | POA: Insufficient documentation

## 2015-04-17 DIAGNOSIS — M7061 Trochanteric bursitis, right hip: Secondary | ICD-10-CM

## 2016-10-01 ENCOUNTER — Encounter: Payer: Self-pay | Admitting: *Deleted

## 2016-10-01 ENCOUNTER — Other Ambulatory Visit: Payer: Self-pay | Admitting: Podiatry

## 2016-10-07 NOTE — Discharge Instructions (Signed)
[facility] °[facility] ° °POST OPERATIVE INSTRUCTIONS FOR DR. TROXLER AND DR. FOWLER °[facility] ° ° °1. Take your medication as prescribed.  Pain medication should be taken only as needed. ° °2. Keep the dressing clean, dry and intact. ° °3. Keep your foot elevated above the heart level for the first 48 hours. ° °4. Walking to the bathroom and brief periods of walking are acceptable, unless we have instructed you to be non-weight bearing. ° °5. Always wear your post-op shoe when walking.  Always use your crutches if you are to be non-weight bearing. ° °6. Do not take a shower. Baths are permissible as long as the foot is kept out of the water.  ° °7. Every hour you are awake:  °- Bend your knee 15 times. °- Flex foot 15 times °- Massage calf 15 times ° °8. Call [facility] ([phone]) if any of the following problems occur: °- You develop a temperature or fever. °- The bandage becomes saturated with blood. °- Medication does not stop your pain. °- Injury of the foot occurs. °- Any symptoms of infection including redness, odor, or red streaks running from wound. ° ° °General Anesthesia, Adult, Care After °These instructions provide you with information about caring for yourself after your procedure. Your health care provider may also give you more specific instructions. Your treatment has been planned according to current medical practices, but problems sometimes occur. Call your health care provider if you have any problems or questions after your procedure. °What can I expect after the procedure? °After the procedure, it is common to have: °· Vomiting. °· A sore throat. °· Mental slowness. ° °It is common to feel: °· Nauseous. °· Cold or shivery. °· Sleepy. °· Tired. °· Sore or achy, even in parts of your body where you did not have surgery. ° °Follow these instructions at home: °For at least 24 hours after the procedure: °· Do not: °? Participate in  activities where you could fall or become injured. °? Drive. °? Use heavy machinery. °? Drink alcohol. °? Take sleeping pills or medicines that cause drowsiness. °? Make important decisions or sign legal documents. °? Take care of children on your own. °· Rest. °Eating and drinking °· If you vomit, drink water, juice, or soup when you can drink without vomiting. °· Drink enough fluid to keep your urine clear or pale yellow. °· Make sure you have little or no nausea before eating solid foods. °· Follow the diet recommended by your health care provider. °General instructions °· Have a responsible adult stay with you until you are awake and alert. °· Return to your normal activities as told by your health care provider. Ask your health care provider what activities are safe for you. °· Take over-the-counter and prescription medicines only as told by your health care provider. °· If you smoke, do not smoke without supervision. °· Keep all follow-up visits as told by your health care provider. This is important. °Contact a health care provider if: °· You continue to have nausea or vomiting at home, and medicines are not helpful. °· You cannot drink fluids or start eating again. °· You cannot urinate after 8-12 hours. °· You develop a skin rash. °· You have fever. °· You have increasing redness at the site of your procedure. °Get help right away if: °· You have difficulty breathing. °· You have chest pain. °· You have unexpected bleeding. °· You feel that you   are having a life-threatening or urgent problem. °This information is not intended to replace advice given to you by your health care provider. Make sure you discuss any questions you have with your health care provider. °Document Released: 05/13/2000 Document Revised: 07/10/2015 Document Reviewed: 01/19/2015 °Elsevier Interactive Patient Education © 2018 Elsevier Inc. ° °

## 2016-10-10 ENCOUNTER — Ambulatory Visit: Payer: 59 | Admitting: Anesthesiology

## 2016-10-10 ENCOUNTER — Encounter
Admission: RE | Disposition: A | Discharge status: Home / Self Care | Payer: Self-pay | Source: Ambulatory Visit | Attending: Podiatry

## 2016-10-10 ENCOUNTER — Ambulatory Visit
Admission: RE | Admit: 2016-10-10 | Discharge: 2016-10-10 | Disposition: A | Discharge status: Home / Self Care | Payer: 59 | Source: Ambulatory Visit | Attending: Podiatry | Admitting: Podiatry

## 2016-10-10 DIAGNOSIS — Z79899 Other long term (current) drug therapy: Secondary | ICD-10-CM | POA: Diagnosis not present

## 2016-10-10 DIAGNOSIS — Z791 Long term (current) use of non-steroidal anti-inflammatories (NSAID): Secondary | ICD-10-CM | POA: Diagnosis not present

## 2016-10-10 DIAGNOSIS — Z7984 Long term (current) use of oral hypoglycemic drugs: Secondary | ICD-10-CM | POA: Insufficient documentation

## 2016-10-10 DIAGNOSIS — K219 Gastro-esophageal reflux disease without esophagitis: Secondary | ICD-10-CM | POA: Insufficient documentation

## 2016-10-10 DIAGNOSIS — M2021 Hallux rigidus, right foot: Secondary | ICD-10-CM | POA: Insufficient documentation

## 2016-10-10 DIAGNOSIS — I10 Essential (primary) hypertension: Secondary | ICD-10-CM | POA: Diagnosis not present

## 2016-10-10 DIAGNOSIS — E039 Hypothyroidism, unspecified: Secondary | ICD-10-CM | POA: Insufficient documentation

## 2016-10-10 DIAGNOSIS — F419 Anxiety disorder, unspecified: Secondary | ICD-10-CM | POA: Diagnosis not present

## 2016-10-10 DIAGNOSIS — E119 Type 2 diabetes mellitus without complications: Secondary | ICD-10-CM | POA: Insufficient documentation

## 2016-10-10 DIAGNOSIS — M1990 Unspecified osteoarthritis, unspecified site: Secondary | ICD-10-CM | POA: Insufficient documentation

## 2016-10-10 DIAGNOSIS — M2020 Hallux rigidus, unspecified foot: Secondary | ICD-10-CM | POA: Diagnosis present

## 2016-10-10 HISTORY — DX: Prediabetes: R73.03

## 2016-10-10 HISTORY — PX: CHEILECTOMY: SHX1336

## 2016-10-10 LAB — GLUCOSE, CAPILLARY
Glucose-Capillary: 139 mg/dL — ABNORMAL HIGH (ref 65–99)
Glucose-Capillary: 158 mg/dL — ABNORMAL HIGH (ref 65–99)

## 2016-10-10 SURGERY — CHEILECTOMY
Anesthesia: General | Laterality: Right | Wound class: Clean

## 2016-10-10 MED ORDER — DEXAMETHASONE SODIUM PHOSPHATE 4 MG/ML IJ SOLN
INTRAMUSCULAR | Status: DC | PRN
  Administered 2016-10-10: 4 mg via INTRAVENOUS

## 2016-10-10 MED ORDER — LIDOCAINE HCL (CARDIAC) 20 MG/ML IV SOLN
INTRAVENOUS | Status: DC | PRN
  Administered 2016-10-10: 50 mg via INTRATRACHEAL

## 2016-10-10 MED ORDER — BUPIVACAINE LIPOSOME 1.3 % IJ SUSP
INTRAMUSCULAR | Status: DC | PRN
  Administered 2016-10-10: 5 mL

## 2016-10-10 MED ORDER — LIDOCAINE HCL 0.5 % IJ SOLN
INTRAMUSCULAR | Status: DC | PRN
  Administered 2016-10-10: 5 mL

## 2016-10-10 MED ORDER — FENTANYL CITRATE (PF) 100 MCG/2ML IJ SOLN
INTRAMUSCULAR | Status: DC | PRN
  Administered 2016-10-10: 100 ug via INTRAVENOUS

## 2016-10-10 MED ORDER — ONDANSETRON 4 MG PO TBDP
4.0000 mg | ORAL_TABLET | Freq: Once | ORAL | Status: AC
  Administered 2016-10-10: 4 mg via ORAL

## 2016-10-10 MED ORDER — GLYCOPYRROLATE 0.2 MG/ML IJ SOLN
INTRAMUSCULAR | Status: DC | PRN
  Administered 2016-10-10: 0.1 mg via INTRAVENOUS

## 2016-10-10 MED ORDER — MEPERIDINE HCL 25 MG/ML IJ SOLN: 6.2500 mg | INTRAMUSCULAR | Status: DC | PRN

## 2016-10-10 MED ORDER — CEFAZOLIN SODIUM-DEXTROSE 2-4 GM/100ML-% IV SOLN
2.0000 g | Freq: Once | INTRAVENOUS | Status: AC
  Administered 2016-10-10: 2 g via INTRAVENOUS

## 2016-10-10 MED ORDER — BUPIVACAINE HCL (PF) 0.5 % IJ SOLN
INTRAMUSCULAR | Status: DC | PRN
  Administered 2016-10-10: 5 mL

## 2016-10-10 MED ORDER — PROMETHAZINE HCL 25 MG/ML IJ SOLN: 6.2500 mg | INTRAMUSCULAR | Status: DC | PRN

## 2016-10-10 MED ORDER — ONDANSETRON HCL 4 MG/2ML IJ SOLN
INTRAMUSCULAR | Status: DC | PRN
  Administered 2016-10-10: 4 mg via INTRAVENOUS

## 2016-10-10 MED ORDER — OXYCODONE HCL 5 MG/5ML PO SOLN: 5.0000 mg | Freq: Once | ORAL | Status: AC | PRN

## 2016-10-10 MED ORDER — PROPOFOL 10 MG/ML IV BOLUS
INTRAVENOUS | Status: DC | PRN
  Administered 2016-10-10: 150 mg via INTRAVENOUS

## 2016-10-10 MED ORDER — MIDAZOLAM HCL 5 MG/5ML IJ SOLN
INTRAMUSCULAR | Status: DC | PRN
  Administered 2016-10-10: 2 mg via INTRAVENOUS

## 2016-10-10 MED ORDER — FENTANYL CITRATE (PF) 100 MCG/2ML IJ SOLN
25.0000 ug | INTRAMUSCULAR | Status: DC | PRN
  Administered 2016-10-10: 25 ug via INTRAVENOUS

## 2016-10-10 MED ORDER — OXYCODONE HCL 5 MG PO TABS
5.0000 mg | ORAL_TABLET | Freq: Once | ORAL | Status: AC | PRN
  Administered 2016-10-10: 5 mg via ORAL

## 2016-10-10 MED ORDER — LACTATED RINGERS IV SOLN
10.0000 mL/h | INTRAVENOUS | Status: DC
  Administered 2016-10-10: 10 mL/h via INTRAVENOUS

## 2016-10-10 MED ORDER — HYDROCODONE-ACETAMINOPHEN 7.5-325 MG PO TABS: 1.0000 | ORAL_TABLET | Freq: Four times a day (QID) | ORAL | 0 refills | Status: DC | PRN

## 2016-10-10 SURGICAL SUPPLY — 40 items
APL SKNCLS STERI-STRIP NONHPOA (GAUZE/BANDAGES/DRESSINGS) ×1
BANDAGE ELASTIC 4 LF NS (GAUZE/BANDAGES/DRESSINGS) ×3 IMPLANT
BENZOIN TINCTURE PRP APPL 2/3 (GAUZE/BANDAGES/DRESSINGS) ×3 IMPLANT
BLADE OSC/SAGITTAL MD 5.5X18 (BLADE) ×2 IMPLANT
BLADE OSC/SAGITTAL MD 9X18.5 (BLADE) ×2 IMPLANT
BNDG CMPR 75X41 PLY HI ABS (GAUZE/BANDAGES/DRESSINGS) ×1
BNDG CMPR MED 5X4 ELC HKLP NS (GAUZE/BANDAGES/DRESSINGS) ×1
BNDG ESMARK 4X12 TAN STRL LF (GAUZE/BANDAGES/DRESSINGS) ×3 IMPLANT
BNDG GAUZE 4.5X4.1 6PLY STRL (MISCELLANEOUS) ×3 IMPLANT
BNDG STRETCH 4X75 STRL LF (GAUZE/BANDAGES/DRESSINGS) ×3 IMPLANT
CANISTER SUCT 1200ML W/VALVE (MISCELLANEOUS) ×3 IMPLANT
CLOSURE WOUND 1/4X4 (GAUZE/BANDAGES/DRESSINGS) ×1
COVER LIGHT HANDLE UNIVERSAL (MISCELLANEOUS) ×6 IMPLANT
CUFF TOURN SGL QUICK 18 (TOURNIQUET CUFF) ×2 IMPLANT
DRAPE FLUOR MINI C-ARM 54X84 (DRAPES) ×3 IMPLANT
DURAPREP 26ML APPLICATOR (WOUND CARE) ×3 IMPLANT
GAUZE PETRO XEROFOAM 1X8 (MISCELLANEOUS) ×3 IMPLANT
GAUZE SPONGE 4X4 12PLY STRL (GAUZE/BANDAGES/DRESSINGS) ×3 IMPLANT
GLOVE BIO SURGEON STRL SZ8 (GLOVE) ×3 IMPLANT
GOWN STRL REUS W/ TWL LRG LVL3 (GOWN DISPOSABLE) ×1 IMPLANT
GOWN STRL REUS W/ TWL XL LVL3 (GOWN DISPOSABLE) ×1 IMPLANT
GOWN STRL REUS W/TWL LRG LVL3 (GOWN DISPOSABLE) ×3
GOWN STRL REUS W/TWL XL LVL3 (GOWN DISPOSABLE) ×3
K-WIRE DBL END TROCAR 6X.045 (WIRE) ×3
KIT ROOM TURNOVER OR (KITS) ×3 IMPLANT
KWIRE DBL END TROCAR 6X.045 (WIRE) IMPLANT
NDL HYPO 25GX1X1/2 BEV (NEEDLE) IMPLANT
NEEDLE HYPO 25GX1X1/2 BEV (NEEDLE) ×3 IMPLANT
NS IRRIG 500ML POUR BTL (IV SOLUTION) ×3 IMPLANT
PACK EXTREMITY ARMC (MISCELLANEOUS) ×3 IMPLANT
PAD GROUND ADULT SPLIT (MISCELLANEOUS) ×3 IMPLANT
RASP SM TEAR CROSS CUT (RASP) ×5 IMPLANT
STOCKINETTE STRL 6IN 960660 (GAUZE/BANDAGES/DRESSINGS) ×3 IMPLANT
STRAP BODY AND KNEE 60X3 (MISCELLANEOUS) ×3 IMPLANT
STRIP CLOSURE SKIN 1/4X4 (GAUZE/BANDAGES/DRESSINGS) ×2 IMPLANT
SUT VIC AB 2-0 CT2 27 (SUTURE) ×2 IMPLANT
SUT VIC AB 2-0 SH 27 (SUTURE) ×3
SUT VIC AB 2-0 SH 27XBRD (SUTURE) IMPLANT
SUT VIC AB 4-0 FS2 27 (SUTURE) ×2 IMPLANT
SYRINGE 10CC LL (SYRINGE) ×2 IMPLANT

## 2016-10-10 NOTE — Transfer of Care (Signed)
Immediate Anesthesia Transfer of Care Note  Patient: Toni Wright Numbers  Procedure(s) Performed: Procedure(s) with comments: CHEILECTOMY (Right) - local with LMA   Patient Location: PACU  Anesthesia Type: General  Level of Consciousness: awake, alert  and patient cooperative  Airway and Oxygen Therapy: Patient Spontanous Breathing and Patient connected to supplemental oxygen  Post-op Assessment: Post-op Vital signs reviewed, Patient's Cardiovascular Status Stable, Respiratory Function Stable, Patent Airway and No signs of Nausea or vomiting  Post-op Vital Signs: Reviewed and stable  Complications: No apparent anesthesia complications

## 2016-10-10 NOTE — Anesthesia Postprocedure Evaluation (Signed)
Anesthesia Post Note  Patient: Toni Wright  Procedure(s) Performed: Procedure(s) (LRB): CHEILECTOMY (Right)  Patient location during evaluation: PACU Anesthesia Type: General Level of consciousness: awake and alert, oriented and patient cooperative Pain management: pain level controlled Vital Signs Assessment: post-procedure vital signs reviewed and stable Respiratory status: spontaneous breathing, nonlabored ventilation and respiratory function stable Cardiovascular status: blood pressure returned to baseline and stable Postop Assessment: adequate PO intake Anesthetic complications: no    Reed Breech

## 2016-10-10 NOTE — Anesthesia Preprocedure Evaluation (Signed)
Anesthesia Evaluation  Patient identified by MRN, date of birth, ID band Patient awake    Reviewed: Allergy & Precautions, NPO status , Patient's Chart, lab work & pertinent test results  History of Anesthesia Complications Negative for: history of anesthetic complications  Airway Mallampati: II  TM Distance: >3 FB Neck ROM: Full    Dental   Permanent upper bridge; no loose or chipped teeth:   Pulmonary neg pulmonary ROS,    Pulmonary exam normal breath sounds clear to auscultation       Cardiovascular Exercise Tolerance: Good hypertension, Normal cardiovascular exam Rhythm:Regular Rate:Normal     Neuro/Psych PSYCHIATRIC DISORDERS Anxiety Vertigo     GI/Hepatic GERD  ,  Endo/Other  diabetes, Type 2Hypothyroidism PCOS  Renal/GU negative Renal ROS     Musculoskeletal  (+) Arthritis , Osteoarthritis,    Abdominal   Peds  Hematology negative hematology ROS (+)   Anesthesia Other Findings   Reproductive/Obstetrics                             Anesthesia Physical Anesthesia Plan  ASA: II  Anesthesia Plan: General   Post-op Pain Management:    Induction: Intravenous  PONV Risk Score and Plan: 2 and Ondansetron and Dexamethasone  Airway Management Planned: LMA  Additional Equipment:   Intra-op Plan:   Post-operative Plan: Extubation in OR  Informed Consent: I have reviewed the patients History and Physical, chart, labs and discussed the procedure including the risks, benefits and alternatives for the proposed anesthesia with the patient or authorized representative who has indicated his/her understanding and acceptance.     Plan Discussed with: CRNA  Anesthesia Plan Comments:         Anesthesia Quick Evaluation

## 2016-10-10 NOTE — Anesthesia Procedure Notes (Signed)
Procedure Name: LMA Insertion Date/Time: 10/10/2016 7:47 AM Performed by: Jimmy Picket Pre-anesthesia Checklist: Patient identified, Emergency Drugs available, Suction available, Timeout performed and Patient being monitored Patient Re-evaluated:Patient Re-evaluated prior to induction Oxygen Delivery Method: Circle system utilized Preoxygenation: Pre-oxygenation with 100% oxygen Induction Type: IV induction LMA: LMA inserted LMA Size: 4.0 Number of attempts: 1 Placement Confirmation: positive ETCO2 and breath sounds checked- equal and bilateral Tube secured with: Tape

## 2016-10-10 NOTE — H&P (Signed)
H and P has been reviewed and no changes are noted.  

## 2016-10-10 NOTE — Op Note (Signed)
Operative note   Surgeon: Dr. Recardo Evangelist, DPM.    Assistant: None    Preop diagnosis: Hallux rigidus right foot    Postop diagnosis: Same    Procedure:   1. Cheilectomy right foot with soft tissue interposition between the first metatarsal phalangeal joint          EBL: Less than 10 cc    Anesthesia:general LMA delivered by anesthesia team delivered a local anesthetic block at the beginning of the case featuring 8 cc of lidocaine and Marcaine 1% and 0.5% respectively injected the base of the operative site. The into the case I injected 10 cc of Exparel at the base of the operative site to try to give long-term anesthesia benefits. This consisted of 10 cc of the expiratory L dilated with 5 cc of 0.5% Marcaine plain and 5 cc of normal saline    Hemostasis: Ankle tourniquet 225 most mercury pressure for 48 minutes    Specimen: Degenerative bone and cartilage from the first metatarsal phalangeal joint    Complications: None    Operative indications: Chronic pain to the right first metatarsophalangeal joint with radiographic evidence of arthritic spurring and degenerative changes to the first metatarsal phalangeal joint right foot    Procedure:  Patient was brought into the OR and placed on the operating table in thesupine position. After anesthesia was obtained theright lower extremity was prepped and draped in usual sterile fashion.  Operative Report: At this time to his directed the dorsum of the first metatarsophalangeal joint the right foot where a 4 cm dorsolinear skin incision was made and deepened sharp blunt dissection. Bleeders were clamped and bovied as required. This point Tissue was identified and incised longitudinally down to bonuses freed away from the dorsal medial lateral aspects of the first metatarsophalangeal joint. 2 separate osteophytes were identified and removed. Excessive bone ridging and spurring was noted particularly on the first metatarsal head this is  resected and rasped smoothly. This was done with power equipment. Also spurring was noted on the dorsal medial lateral aspects of the base of the proximal phalanx this was all resected and rasped smoothly as well. Once adequate bone was removed there is checked FluoroScan good reduction removal of prolific bone was noted. This is also sent to pathology for evaluation. This point is noted as well as at the early part of opening the joint that the dorsal two thirds of the metatarsal head were eroded with subchondral bone exposure and loss of hyalin cartilage. This was true for the base of the proximal phalanx on the lower half of the proximal phalanx base. The subchondral exposed areas were then drilled with shallow drill holes using a 0.5 K wire to stimulate bleeding and fibrocartilage placement.  This time the dorsal lateral capsule of the proximal phalanx was freed from the extensor tendon and was sutured across the metatarsal phalangeal joints and positioned capsule in between the first metatarsal head and the base of the proximal phalanx. This was sutured down with 2-0 Vicryl simple interrupted sutures. Once this was accomplished the area was again copiously irrigated as it had been done earlier and remaining capsular tissue was enclosed with 4 Vicryl in continuous stitch as were deep superficial fascial layers. Skin was closed with 4 Vicryl in subcuticular fashion. This point the area was blocked with the Exparel and a sterile compressive dressings were placed across wound consisting of Steri-Strips Xeroform gauze 4 x 4's Kling and Kerlix tourniquet was released and prompt prevascular seen to  return all digits the right foot.     Patient tolerated the procedure and anesthesia well.  Was transported from the OR to the PACU with all vital signs stable and vascular status intact. To be discharged per routine protocol.  Will follow up in approximately 1 week in the outpatient clinic.

## 2016-10-11 ENCOUNTER — Encounter: Payer: Self-pay | Admitting: Podiatry

## 2016-10-14 LAB — SURGICAL PATHOLOGY

## 2017-07-25 ENCOUNTER — Other Ambulatory Visit: Payer: Self-pay | Admitting: Family Medicine

## 2017-07-25 DIAGNOSIS — N920 Excessive and frequent menstruation with regular cycle: Secondary | ICD-10-CM

## 2017-07-28 ENCOUNTER — Ambulatory Visit
Admission: RE | Admit: 2017-07-28 | Discharge: 2017-07-28 | Disposition: A | Discharge status: Home / Self Care | Payer: 59 | Source: Ambulatory Visit | Attending: Family Medicine | Admitting: Family Medicine

## 2017-07-28 DIAGNOSIS — N920 Excessive and frequent menstruation with regular cycle: Secondary | ICD-10-CM | POA: Diagnosis present

## 2017-08-06 ENCOUNTER — Ambulatory Visit: Payer: 59 | Admitting: Anesthesiology

## 2017-08-06 ENCOUNTER — Encounter: Payer: Self-pay | Admitting: *Deleted

## 2017-08-06 ENCOUNTER — Ambulatory Visit
Admission: RE | Admit: 2017-08-06 | Discharge: 2017-08-06 | Disposition: A | Discharge status: Home / Self Care | Payer: 59 | Source: Ambulatory Visit | Attending: Obstetrics & Gynecology | Admitting: Obstetrics & Gynecology

## 2017-08-06 ENCOUNTER — Other Ambulatory Visit: Payer: Self-pay

## 2017-08-06 ENCOUNTER — Encounter
Admission: RE | Disposition: A | Discharge status: Home / Self Care | Payer: Self-pay | Source: Ambulatory Visit | Attending: Obstetrics & Gynecology

## 2017-08-06 DIAGNOSIS — N950 Postmenopausal bleeding: Secondary | ICD-10-CM | POA: Diagnosis not present

## 2017-08-06 DIAGNOSIS — N8502 Endometrial intraepithelial neoplasia [EIN]: Secondary | ICD-10-CM | POA: Insufficient documentation

## 2017-08-06 DIAGNOSIS — E039 Hypothyroidism, unspecified: Secondary | ICD-10-CM | POA: Insufficient documentation

## 2017-08-06 DIAGNOSIS — Z79890 Hormone replacement therapy (postmenopausal): Secondary | ICD-10-CM | POA: Insufficient documentation

## 2017-08-06 DIAGNOSIS — Z885 Allergy status to narcotic agent status: Secondary | ICD-10-CM | POA: Insufficient documentation

## 2017-08-06 DIAGNOSIS — Z79899 Other long term (current) drug therapy: Secondary | ICD-10-CM | POA: Insufficient documentation

## 2017-08-06 DIAGNOSIS — Z6841 Body Mass Index (BMI) 40.0 and over, adult: Secondary | ICD-10-CM | POA: Insufficient documentation

## 2017-08-06 DIAGNOSIS — I10 Essential (primary) hypertension: Secondary | ICD-10-CM | POA: Diagnosis not present

## 2017-08-06 DIAGNOSIS — F419 Anxiety disorder, unspecified: Secondary | ICD-10-CM | POA: Diagnosis not present

## 2017-08-06 DIAGNOSIS — K219 Gastro-esophageal reflux disease without esophagitis: Secondary | ICD-10-CM | POA: Insufficient documentation

## 2017-08-06 DIAGNOSIS — E282 Polycystic ovarian syndrome: Secondary | ICD-10-CM | POA: Diagnosis not present

## 2017-08-06 DIAGNOSIS — M13879 Other specified arthritis, unspecified ankle and foot: Secondary | ICD-10-CM | POA: Insufficient documentation

## 2017-08-06 DIAGNOSIS — Z888 Allergy status to other drugs, medicaments and biological substances status: Secondary | ICD-10-CM | POA: Insufficient documentation

## 2017-08-06 HISTORY — PX: LAPAROSCOPIC HYSTERECTOMY: SHX1926

## 2017-08-06 HISTORY — PX: LAPAROSCOPIC BILATERAL SALPINGO OOPHERECTOMY: SHX5890

## 2017-08-06 LAB — TYPE AND SCREEN
ABO/RH(D): A POS
Antibody Screen: NEGATIVE

## 2017-08-06 LAB — BASIC METABOLIC PANEL
Anion gap: 10 (ref 5–15)
BUN: 18 mg/dL (ref 6–20)
CHLORIDE: 103 mmol/L (ref 101–111)
CO2: 24 mmol/L (ref 22–32)
Calcium: 9.1 mg/dL (ref 8.9–10.3)
Creatinine, Ser: 0.79 mg/dL (ref 0.44–1.00)
GFR calc Af Amer: >60 mL/min (ref >60)
GFR calc non Af Amer: >60 mL/min (ref >60)
GLUCOSE: 143 mg/dL — AB (ref 65–99)
Potassium: 4.1 mmol/L (ref 3.5–5.1)
Sodium: 137 mmol/L (ref 135–145)

## 2017-08-06 LAB — CBC
HCT: 39.4 % (ref 35.0–47.0)
HEMOGLOBIN: 13.4 g/dL (ref 12.0–16.0)
MCH: 30.1 pg (ref 26.0–34.0)
MCHC: 33.9 g/dL (ref 32.0–36.0)
MCV: 88.6 fL (ref 80.0–100.0)
PLATELETS: 387 10*3/uL (ref 150–440)
RBC: 4.45 MIL/uL (ref 3.80–5.20)
RDW: 14.5 % (ref 11.5–14.5)
WBC: 8.8 10*3/uL (ref 3.6–11.0)

## 2017-08-06 LAB — POCT PREGNANCY, URINE: Preg Test, Ur: NEGATIVE

## 2017-08-06 LAB — GLUCOSE, CAPILLARY: GLUCOSE-CAPILLARY: 175 mg/dL — AB (ref 65–99)

## 2017-08-06 SURGERY — HYSTERECTOMY, TOTAL, LAPAROSCOPIC
Anesthesia: General

## 2017-08-06 MED ORDER — NEOSTIGMINE METHYLSULFATE 10 MG/10ML IV SOLN
INTRAVENOUS | Status: DC | PRN
  Administered 2017-08-06: 3 mg via INTRAVENOUS

## 2017-08-06 MED ORDER — GABAPENTIN 300 MG PO CAPS
600.0000 mg | ORAL_CAPSULE | ORAL | Status: AC
  Administered 2017-08-06: 600 mg via ORAL

## 2017-08-06 MED ORDER — FENTANYL CITRATE (PF) 100 MCG/2ML IJ SOLN
INTRAMUSCULAR | Status: AC
  Filled 2017-08-06: qty 2

## 2017-08-06 MED ORDER — OXYCODONE HCL 5 MG PO TABS: 5.0000 mg | ORAL_TABLET | ORAL | 0 refills | Status: AC | PRN

## 2017-08-06 MED ORDER — DEXAMETHASONE SODIUM PHOSPHATE 4 MG/ML IJ SOLN
4.0000 mg | INTRAMUSCULAR | Status: DC
  Filled 2017-08-06: qty 1

## 2017-08-06 MED ORDER — ONDANSETRON HCL 4 MG/2ML IJ SOLN: 4.0000 mg | Freq: Once | INTRAMUSCULAR | Status: DC | PRN

## 2017-08-06 MED ORDER — SODIUM CHLORIDE 0.9 % IV SOLN
INTRAVENOUS | Status: DC | PRN
  Administered 2017-08-06: 13:00:00 via INTRAVENOUS

## 2017-08-06 MED ORDER — ONDANSETRON HCL 4 MG/2ML IJ SOLN
INTRAMUSCULAR | Status: DC | PRN
  Administered 2017-08-06: 4 mg via INTRAVENOUS

## 2017-08-06 MED ORDER — PHENYLEPHRINE HCL 10 MG/ML IJ SOLN
INTRAMUSCULAR | Status: DC | PRN
  Administered 2017-08-06 (×4): 100 ug via INTRAVENOUS

## 2017-08-06 MED ORDER — KETOROLAC TROMETHAMINE 30 MG/ML IJ SOLN
INTRAMUSCULAR | Status: DC | PRN
  Administered 2017-08-06: 30 mg via INTRAVENOUS

## 2017-08-06 MED ORDER — OXYCODONE HCL 5 MG PO TABS
ORAL_TABLET | ORAL | Status: AC
  Filled 2017-08-06: qty 1

## 2017-08-06 MED ORDER — IBUPROFEN 800 MG PO TABS: 800.0000 mg | ORAL_TABLET | Freq: Four times a day (QID) | ORAL | 1 refills | Status: AC

## 2017-08-06 MED ORDER — MIDAZOLAM HCL 2 MG/2ML IJ SOLN
INTRAMUSCULAR | Status: AC
  Filled 2017-08-06: qty 2

## 2017-08-06 MED ORDER — LACTATED RINGERS IV SOLN
INTRAVENOUS | Status: DC | PRN
  Administered 2017-08-06 (×2): via INTRAVENOUS

## 2017-08-06 MED ORDER — FENTANYL CITRATE (PF) 100 MCG/2ML IJ SOLN: 25.0000 ug | INTRAMUSCULAR | Status: DC | PRN

## 2017-08-06 MED ORDER — MIDAZOLAM HCL 2 MG/2ML IJ SOLN
INTRAMUSCULAR | Status: DC | PRN
  Administered 2017-08-06: 2 mg via INTRAVENOUS

## 2017-08-06 MED ORDER — ROCURONIUM BROMIDE 100 MG/10ML IV SOLN
INTRAVENOUS | Status: DC | PRN
  Administered 2017-08-06: 20 mg via INTRAVENOUS
  Administered 2017-08-06: 10 mg via INTRAVENOUS

## 2017-08-06 MED ORDER — ACETAMINOPHEN 500 MG PO TABS
1000.0000 mg | ORAL_TABLET | ORAL | Status: AC
  Administered 2017-08-06: 1000 mg via ORAL

## 2017-08-06 MED ORDER — ONDANSETRON HCL 4 MG/2ML IJ SOLN
INTRAMUSCULAR | Status: AC
  Filled 2017-08-06: qty 2

## 2017-08-06 MED ORDER — HEPARIN SODIUM (PORCINE) 5000 UNIT/ML IJ SOLN: 5000.0000 [IU] | INTRAMUSCULAR | Status: DC

## 2017-08-06 MED ORDER — LIDOCAINE HCL (CARDIAC) PF 100 MG/5ML IV SOSY
PREFILLED_SYRINGE | INTRAVENOUS | Status: DC | PRN
  Administered 2017-08-06: 100 mg via INTRAVENOUS

## 2017-08-06 MED ORDER — ROCURONIUM BROMIDE 50 MG/5ML IV SOLN
INTRAVENOUS | Status: AC
  Filled 2017-08-06: qty 1

## 2017-08-06 MED ORDER — EPHEDRINE SULFATE 50 MG/ML IJ SOLN
INTRAMUSCULAR | Status: DC | PRN
  Administered 2017-08-06: 10 mg via INTRAVENOUS

## 2017-08-06 MED ORDER — CEFAZOLIN SODIUM 10 G IJ SOLR
3.0000 g | INTRAMUSCULAR | Status: AC
  Administered 2017-08-06: 3 g via INTRAVENOUS
  Filled 2017-08-06: qty 3

## 2017-08-06 MED ORDER — CELECOXIB 200 MG PO CAPS
ORAL_CAPSULE | ORAL | Status: AC
  Filled 2017-08-06: qty 2

## 2017-08-06 MED ORDER — DEXAMETHASONE SODIUM PHOSPHATE 10 MG/ML IJ SOLN
INTRAMUSCULAR | Status: DC | PRN
  Administered 2017-08-06: 4 mg via INTRAVENOUS

## 2017-08-06 MED ORDER — OXYCODONE HCL 5 MG PO TABS
5.0000 mg | ORAL_TABLET | ORAL | Status: DC | PRN
  Administered 2017-08-06: 5 mg via ORAL

## 2017-08-06 MED ORDER — GABAPENTIN 300 MG PO CAPS
ORAL_CAPSULE | ORAL | Status: AC
  Filled 2017-08-06: qty 2

## 2017-08-06 MED ORDER — FENTANYL CITRATE (PF) 100 MCG/2ML IJ SOLN
INTRAMUSCULAR | Status: DC | PRN
  Administered 2017-08-06 (×3): 50 ug via INTRAVENOUS

## 2017-08-06 MED ORDER — PROPOFOL 10 MG/ML IV BOLUS
INTRAVENOUS | Status: AC
  Filled 2017-08-06: qty 20

## 2017-08-06 MED ORDER — LIDOCAINE HCL (PF) 2 % IJ SOLN
INTRAMUSCULAR | Status: AC
  Filled 2017-08-06: qty 10

## 2017-08-06 MED ORDER — EPHEDRINE SULFATE 50 MG/ML IJ SOLN
INTRAMUSCULAR | Status: AC
  Filled 2017-08-06: qty 1

## 2017-08-06 MED ORDER — SCOPOLAMINE 1 MG/3DAYS TD PT72
1.0000 | MEDICATED_PATCH | TRANSDERMAL | Status: DC
  Administered 2017-08-06: 1.5 mg via TRANSDERMAL

## 2017-08-06 MED ORDER — SUCCINYLCHOLINE CHLORIDE 20 MG/ML IJ SOLN
INTRAMUSCULAR | Status: DC | PRN
  Administered 2017-08-06: 100 mg via INTRAVENOUS

## 2017-08-06 MED ORDER — HEPARIN SODIUM (PORCINE) 5000 UNIT/ML IJ SOLN
INTRAMUSCULAR | Status: AC
  Administered 2017-08-06: 5000 [IU]
  Filled 2017-08-06: qty 1

## 2017-08-06 MED ORDER — CELECOXIB 200 MG PO CAPS
400.0000 mg | ORAL_CAPSULE | ORAL | Status: AC
  Administered 2017-08-06: 400 mg via ORAL

## 2017-08-06 MED ORDER — GLYCOPYRROLATE 0.2 MG/ML IJ SOLN
INTRAMUSCULAR | Status: DC | PRN
  Administered 2017-08-06: 0.6 mg via INTRAVENOUS

## 2017-08-06 MED ORDER — DEXAMETHASONE SODIUM PHOSPHATE 10 MG/ML IJ SOLN
INTRAMUSCULAR | Status: AC
  Administered 2017-08-06: 4 mg
  Filled 2017-08-06: qty 1

## 2017-08-06 MED ORDER — ACETAMINOPHEN 500 MG PO TABS
ORAL_TABLET | ORAL | Status: AC
  Filled 2017-08-06: qty 2

## 2017-08-06 MED ORDER — SCOPOLAMINE 1 MG/3DAYS TD PT72
MEDICATED_PATCH | TRANSDERMAL | Status: AC
  Filled 2017-08-06: qty 1

## 2017-08-06 MED ORDER — PROPOFOL 10 MG/ML IV BOLUS
INTRAVENOUS | Status: DC | PRN
  Administered 2017-08-06: 200 mg via INTRAVENOUS

## 2017-08-06 SURGICAL SUPPLY — 55 items
ADH SKN CLS APL DERMABOND .7 (GAUZE/BANDAGES/DRESSINGS) ×2
BACTOSHIELD CHG 4% 4OZ (MISCELLANEOUS) ×1
BAG URINE DRAINAGE (UROLOGICAL SUPPLIES) ×4 IMPLANT
BLADE SURG SZ11 CARB STEEL (BLADE) ×4 IMPLANT
CANISTER SUCT 1200ML W/VALVE (MISCELLANEOUS) ×4 IMPLANT
CATH FOLEY 2WAY  5CC 16FR (CATHETERS) ×2
CATH FOLEY 2WAY 5CC 16FR (CATHETERS) ×2
CATH URTH 16FR FL 2W BLN LF (CATHETERS) ×2 IMPLANT
CHLORAPREP W/TINT 26ML (MISCELLANEOUS) ×8 IMPLANT
DEFOGGER SCOPE WARMER CLEARIFY (MISCELLANEOUS) ×4 IMPLANT
DERMABOND ADVANCED (GAUZE/BANDAGES/DRESSINGS) ×2
DERMABOND ADVANCED .7 DNX12 (GAUZE/BANDAGES/DRESSINGS) ×2 IMPLANT
DEVICE SUTURE ENDOST 10MM (ENDOMECHANICALS) IMPLANT
DRAPE LEGGINS SURG 28X43 STRL (DRAPES) ×4 IMPLANT
DRAPE SHEET LG 3/4 BI-LAMINATE (DRAPES) ×4 IMPLANT
DRAPE UNDER BUTTOCK W/FLU (DRAPES) ×4 IMPLANT
ELECT REM PT RETURN 9FT ADLT (ELECTROSURGICAL) ×4
ELECTRODE REM PT RTRN 9FT ADLT (ELECTROSURGICAL) ×2 IMPLANT
GLOVE PI ORTHOPRO 6.5 (GLOVE) ×2
GLOVE PI ORTHOPRO STRL 6.5 (GLOVE) ×2 IMPLANT
GLOVE SURG SYN 6.5 ES PF (GLOVE) ×12 IMPLANT
GLOVE SURG SYN 6.5 PF PI (GLOVE) ×6 IMPLANT
GOWN STRL REUS W/ TWL LRG LVL3 (GOWN DISPOSABLE) ×6 IMPLANT
GOWN STRL REUS W/ TWL XL LVL3 (GOWN DISPOSABLE) ×2 IMPLANT
GOWN STRL REUS W/TWL LRG LVL3 (GOWN DISPOSABLE) ×12
GOWN STRL REUS W/TWL XL LVL3 (GOWN DISPOSABLE) ×4
IRRIGATION STRYKERFLOW (MISCELLANEOUS) ×2 IMPLANT
IRRIGATOR STRYKERFLOW (MISCELLANEOUS) ×4
IV LACTATED RINGERS 1000ML (IV SOLUTION) IMPLANT
KIT PINK PAD W/HEAD ARE REST (MISCELLANEOUS) ×4
KIT PINK PAD W/HEAD ARM REST (MISCELLANEOUS) ×2 IMPLANT
KIT TURNOVER CYSTO (KITS) ×4 IMPLANT
L-HOOK LAP DISP 36CM (ELECTROSURGICAL)
LHOOK LAP DISP 36CM (ELECTROSURGICAL) IMPLANT
LIGASURE VESSEL 5MM BLUNT TIP (ELECTROSURGICAL) IMPLANT
MANIPULATOR VCARE LG CRV RETR (MISCELLANEOUS) IMPLANT
MANIPULATOR VCARE SML CRV RETR (MISCELLANEOUS) IMPLANT
MANIPULATOR VCARE STD CRV RETR (MISCELLANEOUS) IMPLANT
NS IRRIG 500ML POUR BTL (IV SOLUTION) ×4 IMPLANT
PACK LAP CHOLECYSTECTOMY (MISCELLANEOUS) ×4 IMPLANT
PAD OB MATERNITY 4.3X12.25 (PERSONAL CARE ITEMS) ×4 IMPLANT
PAD PREP 24X41 OB/GYN DISP (PERSONAL CARE ITEMS) ×4 IMPLANT
PENCIL ELECTRO HAND CTR (MISCELLANEOUS) ×4 IMPLANT
SCRUB CHG 4% DYNA-HEX 4OZ (MISCELLANEOUS) ×3 IMPLANT
SET CYSTO W/LG BORE CLAMP LF (SET/KITS/TRAYS/PACK) IMPLANT
SET YANKAUER POOLE SUCT (MISCELLANEOUS) ×4 IMPLANT
SLEEVE ENDOPATH XCEL 5M (ENDOMECHANICALS) ×6 IMPLANT
SURGILUBE 2OZ TUBE FLIPTOP (MISCELLANEOUS) ×4 IMPLANT
SUT ENDO VLOC 180-0-8IN (SUTURE) IMPLANT
SUT MNCRL 4-0 (SUTURE) ×4
SUT MNCRL 4-0 27XMFL (SUTURE) ×2
SUT VIC AB 0 CT1 36 (SUTURE) ×8 IMPLANT
SUTURE MNCRL 4-0 27XMF (SUTURE) ×2 IMPLANT
TROCAR XCEL NON-BLD 5MMX100MML (ENDOMECHANICALS) ×4 IMPLANT
TUBING INSUF HEATED (TUBING) ×4 IMPLANT

## 2017-08-06 NOTE — Progress Notes (Signed)
Chaplain was paged for pray. Chaplain prayed for Pt with parents, daughter and husband at the bedside. Chaplain prayed for full recovery and the care team.
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
## 2017-08-06 NOTE — Transfer of Care (Signed)
Immediate Anesthesia Transfer of Care Note  Patient: Toni Wright  Procedure(s) Performed: HYSTERECTOMY TOTAL LAPAROSCOPIC (N/A ) LAPAROSCOPIC BILATERAL SALPINGO OOPHORECTOMY (Bilateral )  Patient Location: PACU  Anesthesia Type:General  Level of Consciousness: awake  Airway & Oxygen Therapy: Patient Spontanous Breathing and Patient connected to face mask oxygen  Post-op Assessment: Report given to RN and Post -op Vital signs reviewed and stable  Post vital signs: Reviewed and stable  Last Vitals:  Vitals Value Taken Time  BP 139/66 08/06/2017  2:48 PM  Temp 36.9 C 08/06/2017  2:45 PM  Pulse 76 08/06/2017  2:49 PM  Resp 12 08/06/2017  2:49 PM  SpO2 100 % 08/06/2017  2:49 PM  Vitals shown include unvalidated device data.  Last Pain:  Vitals:   08/06/17 0850  TempSrc: Temporal  PainSc: 0-No pain         Complications: No apparent anesthesia complications

## 2017-08-06 NOTE — Anesthesia Postprocedure Evaluation (Signed)
Anesthesia Post Note  Patient: Yetta NumbersCandy H Hur  Procedure(s) Performed: HYSTERECTOMY TOTAL LAPAROSCOPIC (N/A ) LAPAROSCOPIC BILATERAL SALPINGO OOPHORECTOMY (Bilateral )  Patient location during evaluation: PACU Anesthesia Type: General Level of consciousness: awake and alert and oriented Pain management: pain level controlled Vital Signs Assessment: post-procedure vital signs reviewed and stable Respiratory status: spontaneous breathing Cardiovascular status: blood pressure returned to baseline Anesthetic complications: no     Last Vitals:  Vitals:   08/06/17 1445 08/06/17 1500  BP: 139/66 133/67  Pulse: 85 72  Resp: 13 17  Temp: 36.9 C   SpO2: 99% 100%    Last Pain:  Vitals:   08/06/17 1445  TempSrc:   PainSc: Asleep                 Octave Montrose

## 2017-08-06 NOTE — Anesthesia Procedure Notes (Signed)
Procedure Name: Intubation Date/Time: 08/06/2017 12:00 PM Performed by: Babs Sciara, CRNA Pre-anesthesia Checklist: Patient identified, Emergency Drugs available, Suction available, Patient being monitored and Timeout performed Patient Re-evaluated:Patient Re-evaluated prior to induction Oxygen Delivery Method: Circle system utilized Preoxygenation: Pre-oxygenation with 100% oxygen Induction Type: IV induction Ventilation: Mask ventilation without difficulty Laryngoscope Size: Mac and 3 Grade View: Grade II Tube type: Oral Tube size: 7.5 mm Number of attempts: 1 Placement Confirmation: ETT inserted through vocal cords under direct vision (cricoid pressure req'd for view of cords) Secured at: 21 cm Tube secured with: Tape Dental Injury: Teeth and Oropharynx as per pre-operative assessment  Future Recommendations: Recommend- induction with short-acting agent, and alternative techniques readily available

## 2017-08-06 NOTE — Anesthesia Post-op Follow-up Note (Signed)
Anesthesia QCDR form completed.        

## 2017-08-06 NOTE — Discharge Instructions (Signed)
Discharge instructions:  °Call office if you have any of the following: fever >101 F, chills, excessive vaginal bleeding, incision drainage or problems, leg pain or redness, or any other concerns.  ° °Activity: Do not lift > 10 lbs for 8 weeks.  °No intercourse or tampons for 8 weeks.  °No driving for 1-2 weeks.  ° °You may feel some pain in your upper right abdomen/rib and right shoulder.  This is from the gas in the abdomen for surgery. This will subside over time, please be patient! ° °Take 600mg Ibuprofen and 1000mg Tylenol around the clock, every 6 hours for at least the first 3-5 days.  After this you can take as needed.  This will help decrease inflammation and promote healing.  The narcotics you'll take just as needed, as they just trick your brain into thinking its not in pain.   ° °Please don't limit yourself in terms of routine activity.  You will be able to do most things, although they may take longer to do or be a little painful.  You can do it! ° °Don't be a hero, but don't be a wimp either!  ° ° °AMBULATORY SURGERY  °DISCHARGE INSTRUCTIONS ° ° °1) The drugs that you were given will stay in your system until tomorrow so for the next 24 hours you should not: ° °A) Drive an automobile °B) Make any legal decisions °C) Drink any alcoholic beverage ° ° °2) You may resume regular meals tomorrow.  Today it is better to start with liquids and gradually work up to solid foods. ° °You may eat anything you prefer, but it is better to start with liquids, then soup and crackers, and gradually work up to solid foods. ° ° °3) Please notify your doctor immediately if you have any unusual bleeding, trouble breathing, redness and pain at the surgery site, drainage, fever, or pain not relieved by medication. ° ° ° °4) Additional Instructions: ° ° ° ° ° ° ° °Please contact your physician with any problems or Same Day Surgery at [phone], Monday through Friday 6 am to 4 pm, or [place] [facility] [place] [facility] number at  [phone]. ° °

## 2017-08-06 NOTE — Progress Notes (Deleted)
Total Laparoscopic Hysterectomy Operative Note Procedure Date: 08/06/2017  Patient:  Toni Wright  52 y.o. female  PRE-OPERATIVE DIAGNOSIS: PMB, ENDOMETRIAL COMPLEX ATYPICAL HYPERPLASIA, MORBID OBESITY.   POST-OPERATIVE DIAGNOSIS: PMB, Endometrial Complex Atypical Hyperplasia  PROCEDURE:  Procedure(s): HYSTERECTOMY TOTAL LAPAROSCOPIC (N/A) LAPAROSCOPIC BILATERAL SALPINGO OOPHORECTOMY (Bilateral)  SURGEON:  Surgeon(s) and Role:    * Shelvy Perazzo C, MD - Primary Jamie Benson - RNFA   ANESTHESIA:  General via ET  I/O  Total I/O In: 2000 [I.V.:2000] Out: 350 [Urine:300; Blood:50]  FINDINGS: top-normal sized uterus, large cervix, left sided ovarian cyst, otherwise normal appearing ovaries and fallopian tubes bilaterally.  Normal upper abdomen, normal appendix.  Constricted vaginal apex, redundant vulvar tissue.      SPECIMEN: Uterus, Cervix, and bilateral fallopian tubes and ovaries, pelvic washings.  COMPLICATIONS: none apparent  DISPOSITION: vital signs stable to PACU  Indication for Surgery: 52 y.o. with postmenopausal bleeding and found to have complex atypical hyperplasia on endometrial biopsy.  She was offered hormonal vs surgical intervention and elected surgery, with removal of uterus, cervix, tubes, and ovaries.  Possible pelvic lymph node dissection if cancer found on frozen pathology.  Risks of surgery were discussed with the patient including but not limited to: bleeding which may require transfusion or reoperation; infection which may require antibiotics; injury to bowel, bladder, ureters or other surrounding organs; need for additional procedures including laparotomy, blood clot, incisional problems and other postoperative/anesthesia complications. Written informed consent was obtained.      PROCEDURE IN DETAIL:  The patient had 5000u Heparin Sub-q and sequential compression devices applied to her lower extremities while in the preoperative area.  She was then taken  to the operating room. IV antibiotics were given. General anesthesia was administered via endotracheal route.  She was placed in the dorsal lithotomy position, and was prepped and draped in a sterile manner. A surgical time-out was performed.  A Foley catheter was inserted into her bladder and attached to constant drainage and a V-Care uterine manipulator was then advanced into the uterus and a good fit around the cervix was noted. The gloves were changed, and attention was turned to the abdomen where an umbilical incision was made with the scalpel.  A 5mm trochar was inserted in the umbilical incision using a visiport method.Opening pressure was 9mmHg, and the abdomen was insufflated to 15mmHg carbon dioxide gas and adequate pneumoperitoneum was obtained. A survey of the patient's pelvis and abdomen revealed the findings as mentioned above. Two 5mm ports were inserted in the lower left and right quadrants under visualization.   The patient did not tolerate much trendelenburg, making the surgery technically difficult due to redundant and adherent small and large bowel in the pelvis. The bilateral round ligaments were transected. The medial broad ligaments were retracted and the ureters were seen vermiculating bilaterally.  The IP ligaments were isolated and thrice cauterized and divided with the Ligasure.  The anterior broad ligaments were divided and brought across the uterus to separate the vesicouterine peritoneum and create a bladder flap. The bladder was pushed away from the uterus. The bilateral uterine arteries were skeletonized, ligated and transected. The bilateral uterosacral and cardinal ligaments were ligated and transected. A colpotomy was made around the V-Care cervical cup and the uterus, cervix, and bilateral tubes and ovaries were removed through the vagina. The vaginal cuff was closed vaginally using 0-Vicryl in a running locking stitch. Again, due to the patient not being able to be in  much trendelenburg, and due to   the uncovered vaginal stricture, this part of the procedure was technically more difficult, yet the only plausible option for closure of the vaginal cuff due to bowel involvement intraabdominally. This was tested for integrity using the surgeon's finger. After a change of gloves, the pneumoperitoneum was recreated and surgical site inspected, and found to be hemostatic. Bilateral ureters were visualized vermiuclating. No intraoperative injury to surrounding organs was noted. The abdomen was desufflated and all instruments were then removed.   All skin incisions were closed with 4-0 monocryl and covered with surgical glue. The patient tolerated the procedures well.  All instruments, needles, and sponge counts were correct x 2. The patient was taken to the recovery room in stable condition.   There were conditions about this patient that made the surgery technically difficult, extending the typical time, and effort from the surgeon and surgical staff.   ---- Cortavius Montesinos, MD Attending Obstetrician and Gynecologist [facility]   

## 2017-08-07 ENCOUNTER — Encounter: Payer: Self-pay | Admitting: Obstetrics & Gynecology

## 2017-08-07 LAB — CYTOLOGY - NON PAP

## 2017-08-07 LAB — SURGICAL PATHOLOGY

## 2017-08-07 NOTE — Op Note (Addendum)
Total Laparoscopic Hysterectomy Operative Note Procedure Date: 08/06/2017  Patient:  Toni Wright  52 y.o. female  PRE-OPERATIVE DIAGNOSIS: PMB, ENDOMETRIAL COMPLEX ATYPICAL HYPERPLASIA, MORBID OBESITY.   POST-OPERATIVE DIAGNOSIS: PMB, Endometrial Complex Atypical Hyperplasia  PROCEDURE:  Procedure(s): HYSTERECTOMY TOTAL LAPAROSCOPIC (N/A) LAPAROSCOPIC BILATERAL SALPINGO OOPHORECTOMY (Bilateral)  SURGEON:  Surgeon(s) and Role:    * Talor Desrosiers, Elenora Fenderhelsea C, MD - Primary Laneta SimmersJamie Benson - RNFA   ANESTHESIA:  General via ET  I/O  Total I/O In: 2000 [I.V.:2000] Out: 350 [Urine:300; Blood:50]  FINDINGS: top-normal sized uterus, large cervix, left sided ovarian cyst, otherwise normal appearing ovaries and fallopian tubes bilaterally.  Normal upper abdomen, normal appendix.  Constricted vaginal apex, redundant vulvar tissue.      SPECIMEN: Uterus, Cervix, and bilateral fallopian tubes and ovaries, pelvic washings.  COMPLICATIONS: none apparent  DISPOSITION: vital signs stable to PACU  Indication for Surgery: 52 y.o. with postmenopausal bleeding and found to have complex atypical hyperplasia on endometrial biopsy.  She was offered hormonal vs surgical intervention and elected surgery, with removal of uterus, cervix, tubes, and ovaries.  Possible pelvic lymph node dissection if cancer found on frozen pathology.  Risks of surgery were discussed with the patient including but not limited to: bleeding which may require transfusion or reoperation; infection which may require antibiotics; injury to bowel, bladder, ureters or other surrounding organs; need for additional procedures including laparotomy, blood clot, incisional problems and other postoperative/anesthesia complications. Written informed consent was obtained.      PROCEDURE IN DETAIL:  The patient had 5000u Heparin Sub-q and sequential compression devices applied to her lower extremities while in the preoperative area.  She was then taken  to the operating room. IV antibiotics were given. General anesthesia was administered via endotracheal route.  She was placed in the dorsal lithotomy position, and was prepped and draped in a sterile manner. A surgical time-out was performed.  A Foley catheter was inserted into her bladder and attached to constant drainage and a V-Care uterine manipulator was then advanced into the uterus and a good fit around the cervix was noted. The gloves were changed, and attention was turned to the abdomen where an umbilical incision was made with the scalpel.  A 5mm trochar was inserted in the umbilical incision using a visiport method.Opening pressure was 9mmHg, and the abdomen was insufflated to 15mmHg carbon dioxide gas and adequate pneumoperitoneum was obtained. A survey of the patient's pelvis and abdomen revealed the findings as mentioned above. Two 5mm ports were inserted in the lower left and right quadrants under visualization.   The patient did not tolerate much trendelenburg, making the surgery technically difficult due to redundant and adherent small and large bowel in the pelvis. The bilateral round ligaments were transected. The medial broad ligaments were retracted and the ureters were seen vermiculating bilaterally.  The IP ligaments were isolated and thrice cauterized and divided with the Ligasure.  The anterior broad ligaments were divided and brought across the uterus to separate the vesicouterine peritoneum and create a bladder flap. The bladder was pushed away from the uterus. The bilateral uterine arteries were skeletonized, ligated and transected. The bilateral uterosacral and cardinal ligaments were ligated and transected. A colpotomy was made around the V-Care cervical cup and the uterus, cervix, and bilateral tubes and ovaries were removed through the vagina. The vaginal cuff was closed vaginally using 0-Vicryl in a running locking stitch. Again, due to the patient not being able to be in  much trendelenburg, and due to  the uncovered vaginal stricture, this part of the procedure was technically more difficult, yet the only plausible option for closure of the vaginal cuff due to bowel involvement intraabdominally. This was tested for integrity using the surgeon's finger. After a change of gloves, the pneumoperitoneum was recreated and surgical site inspected, and found to be hemostatic. Bilateral ureters were visualized vermiuclating. No intraoperative injury to surrounding organs was noted. The abdomen was desufflated and all instruments were then removed.   All skin incisions were closed with 4-0 monocryl and covered with surgical glue. The patient tolerated the procedures well.  All instruments, needles, and sponge counts were correct x 2. The patient was taken to the recovery room in stable condition.   There were conditions about this patient that made the surgery technically difficult, extending the typical time, and effort from the surgeon and surgical staff.   ---- Ranae Plumber, MD Attending Obstetrician and Gynecologist [facility]

## 2019-05-10 ENCOUNTER — Ambulatory Visit: Payer: 59 | Attending: Internal Medicine

## 2019-05-10 DIAGNOSIS — Z23 Encounter for immunization: Secondary | ICD-10-CM

## 2019-05-10 NOTE — Progress Notes (Signed)
   Covid-19 Vaccination Clinic  Name:  Toni Wright    MRN: [id number] DOB: Sep 30, 1965  05/10/2019  [name] was observed post Covid-19 immunization for 15 minutes without incident. She was provided with Vaccine Information Sheet and instruction to access the V-Safe system.   [name] was instructed to call 911 with any severe reactions post vaccine: Marland Kitchen Difficulty breathing  . Swelling of face and throat  . A fast heartbeat  . A bad rash all over body  . Dizziness and weakness   Immunizations Administered    Name Date Dose VIS Date Route   Pfizer COVID-19 Vaccine 05/10/2019 11:53 AM 0.3 mL 01/29/2019 Intramuscular   Manufacturer: [employer], [employer]   Lot: [id number]   NDC: [address]-3

## 2019-06-08 ENCOUNTER — Ambulatory Visit: Payer: 59 | Attending: Internal Medicine

## 2019-06-08 DIAGNOSIS — Z23 Encounter for immunization: Secondary | ICD-10-CM

## 2019-06-08 NOTE — Progress Notes (Signed)
   Covid-19 Vaccination Clinic  Name:  Toni Wright    MRN: [id number] DOB: October 18, 1965  06/08/2019  [name] was observed post Covid-19 immunization for 15 minutes without incident. She was provided with Vaccine Information Sheet and instruction to access the V-Safe system.   [name] was instructed to call 911 with any severe reactions post vaccine: Marland Kitchen Difficulty breathing  . Swelling of face and throat  . A fast heartbeat  . A bad rash all over body  . Dizziness and weakness   Immunizations Administered    Name Date Dose VIS Date Route   Pfizer COVID-19 Vaccine 06/08/2019 10:56 AM 0.3 mL 04/14/2018 Intramuscular   Manufacturer: [employer], [employer]   Lot: [id number]   NDC: [address]-1

## 2019-11-15 ENCOUNTER — Other Ambulatory Visit: Payer: Self-pay | Admitting: Physical Medicine and Rehabilitation

## 2019-11-15 DIAGNOSIS — M5441 Lumbago with sciatica, right side: Secondary | ICD-10-CM

## 2019-11-15 DIAGNOSIS — M25559 Pain in unspecified hip: Secondary | ICD-10-CM

## 2019-11-26 ENCOUNTER — Ambulatory Visit (INDEPENDENT_AMBULATORY_CARE_PROVIDER_SITE_OTHER): Payer: 59 | Admitting: Internal Medicine

## 2019-11-26 VITALS — Ht 64.0 in | Wt 265.0 lb

## 2019-11-26 DIAGNOSIS — K219 Gastro-esophageal reflux disease without esophagitis: Secondary | ICD-10-CM

## 2019-11-26 DIAGNOSIS — G4733 Obstructive sleep apnea (adult) (pediatric): Secondary | ICD-10-CM

## 2019-11-26 DIAGNOSIS — I10 Essential (primary) hypertension: Secondary | ICD-10-CM

## 2019-11-26 DIAGNOSIS — E039 Hypothyroidism, unspecified: Secondary | ICD-10-CM

## 2019-11-26 NOTE — Patient Instructions (Signed)
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
## 2019-11-30 ENCOUNTER — Ambulatory Visit: Payer: 59

## 2019-12-22 ENCOUNTER — Ambulatory Visit: Payer: 59

## 2020-01-04 ENCOUNTER — Ambulatory Visit: Admission: RE | Admit: 2020-01-04 | Payer: 59 | Source: Ambulatory Visit

## 2020-01-15 ENCOUNTER — Ambulatory Visit
Admission: RE | Admit: 2020-01-15 | Discharge: 2020-01-15 | Disposition: A | Discharge status: Home / Self Care | Payer: 59 | Source: Ambulatory Visit | Attending: Physical Medicine and Rehabilitation | Admitting: Physical Medicine and Rehabilitation

## 2020-01-15 ENCOUNTER — Encounter: Payer: Self-pay | Admitting: Radiology

## 2020-01-15 DIAGNOSIS — M5441 Lumbago with sciatica, right side: Secondary | ICD-10-CM | POA: Insufficient documentation

## 2020-01-15 DIAGNOSIS — M25559 Pain in unspecified hip: Secondary | ICD-10-CM

## 2021-05-21 IMAGING — MR MR HIP*R* W/O CM
5 series · 38 of 40 positions shown · non-contrast
Comparison: MRI 04/17/2015

CLINICAL DATA: Chronic right hip pain

EXAM:
MR OF THE RIGHT HIP WITHOUT CONTRAST
TECHNIQUE: Multiplanar, multisequence MR imaging was performed. No intravenous
contrast was administered.

[Series 8: T1 · coronal · right · 4.0mm · 1.19mm/px · 9 of 40 slices shown]
[im 1/40]
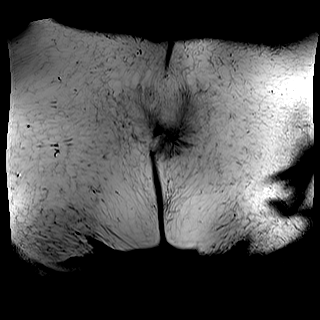
[im 5/40]
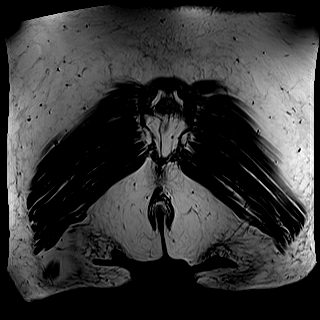
[im 10/40]
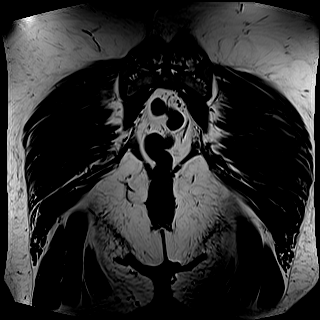
[im 15/40]
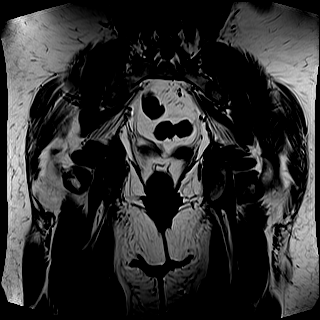
[im 20/40]
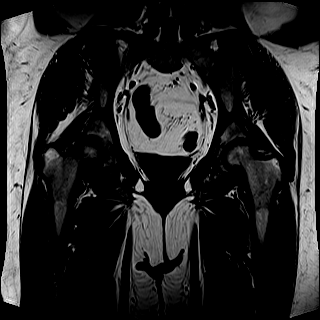
[im 25/40]
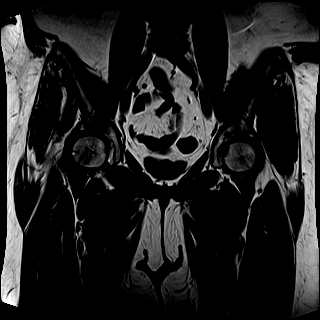
[im 30/40]
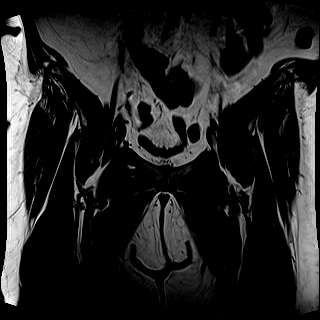
[im 35/40]
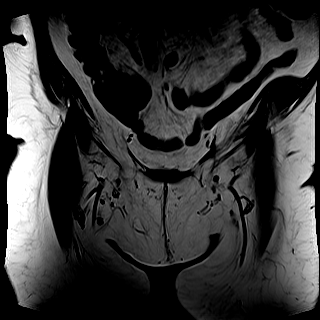
[im 40/40]
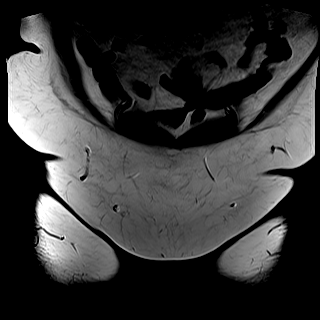

[Series 9: STIR · coronal · right · 4.0mm · 1.19mm/px · 8 of 40 slices shown]
[im 1/40]
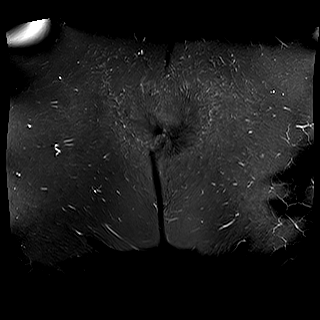
[im 5/40]
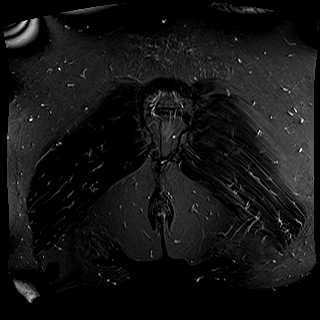
[im 14/40]
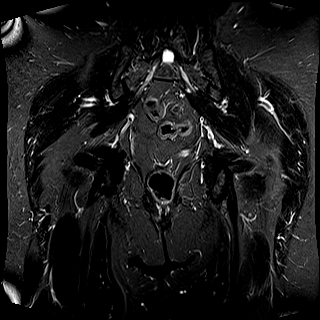
[im 18/40]
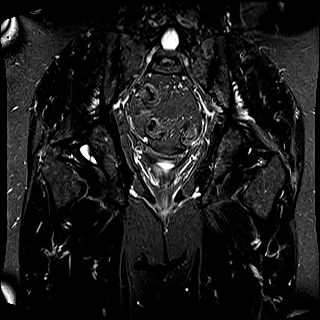
[im 22/40]
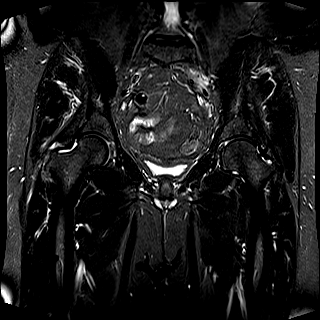
[im 27/40]
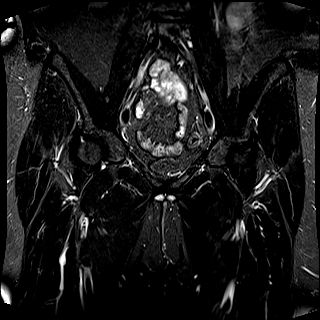
[im 35/40]
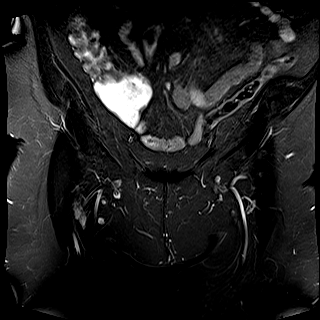
[im 40/40]
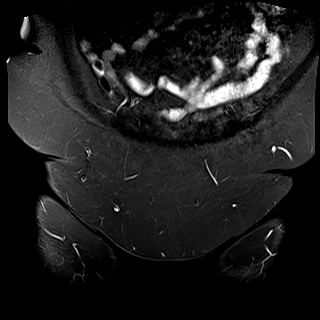

[Series 10: T2 fat-sat · axial · right · 4.0mm · 0.70mm/px · z∈[-12,+133]mm · 7 of 30 slices shown]
[im 1/30]
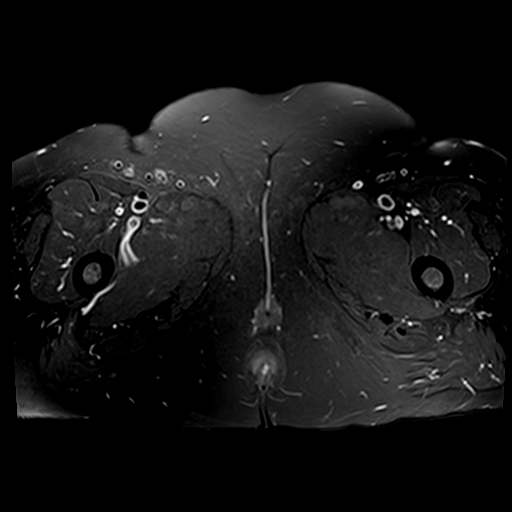
[im 5/30]
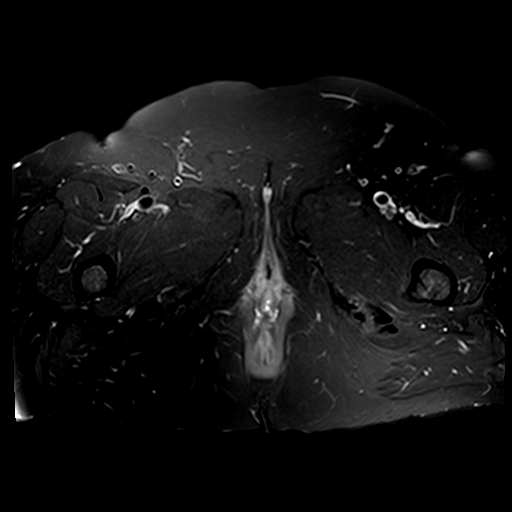
[im 10/30]
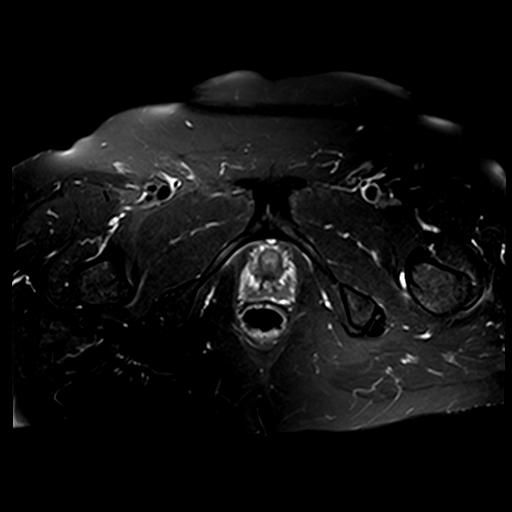
[im 15/30]
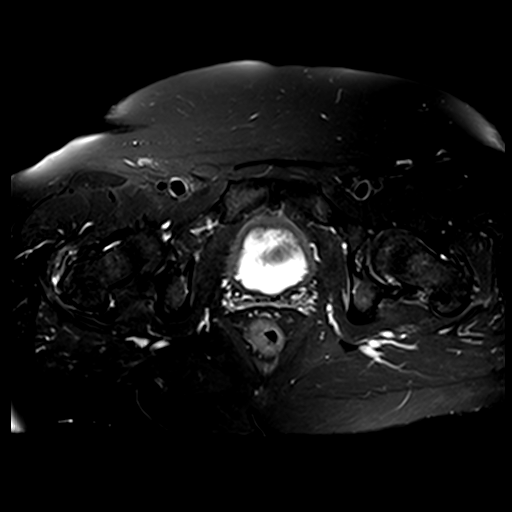
[im 20/30]
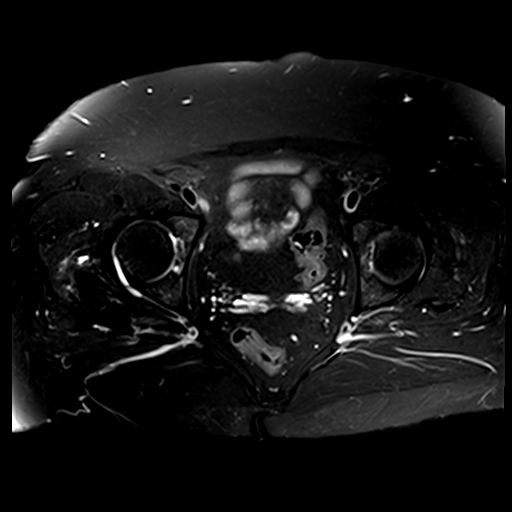
[im 25/30]
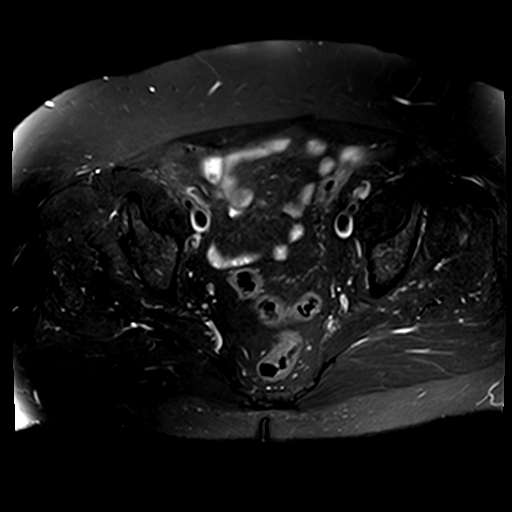
[im 30/30]
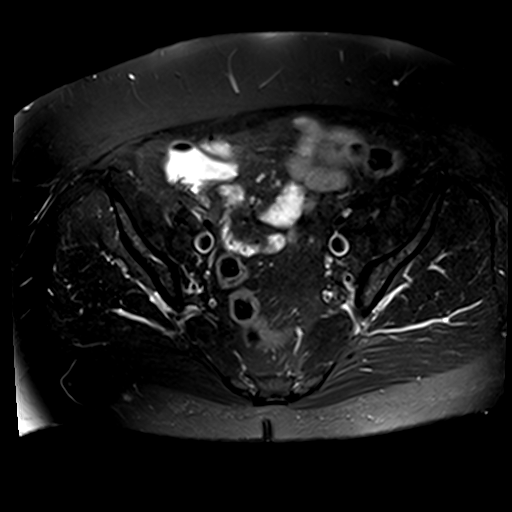

[Series 11: PD fat-sat · sagittal · right · 4.0mm · 0.70mm/px · 7 of 29 slices shown (1 of 2)]
[im 1/29]
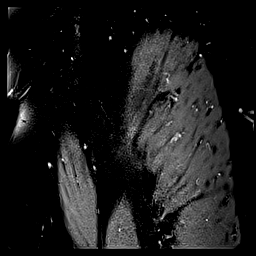
[im 5/29]
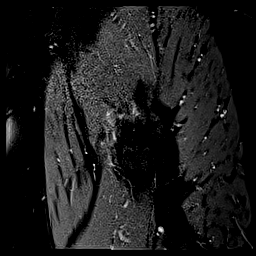
[im 10/29]
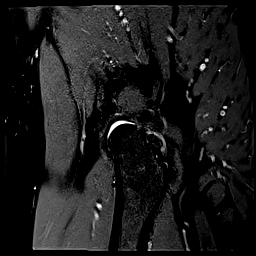
[im 15/29]
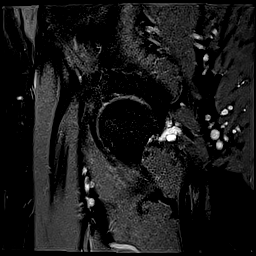
[im 19/29]
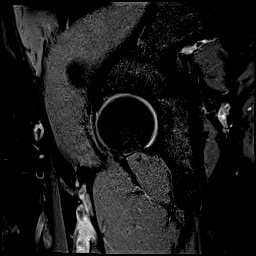
[im 24/29]
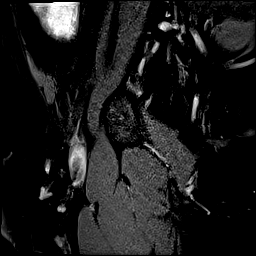
[im 29/29]
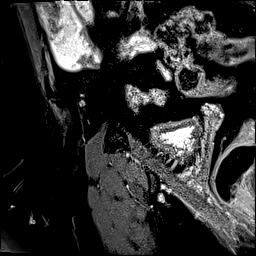

[Series 12: PD fat-sat · coronal · right · 4.0mm · 0.70mm/px · 7 of 29 slices shown (2 of 2)]
[im 1/29]
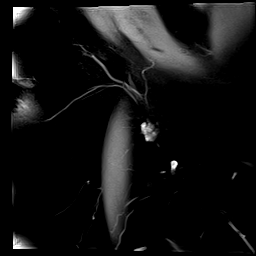
[im 5/29]
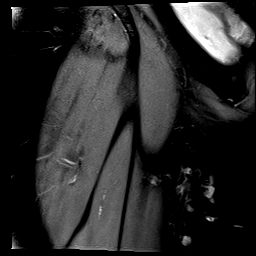
[im 10/29]
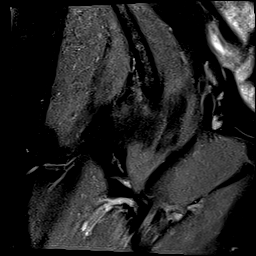
[im 15/29]
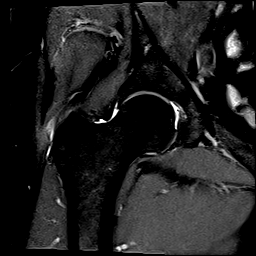
[im 19/29]
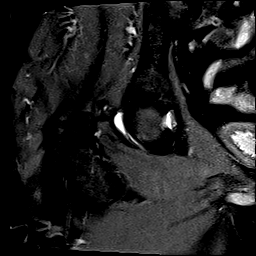
[im 24/29]
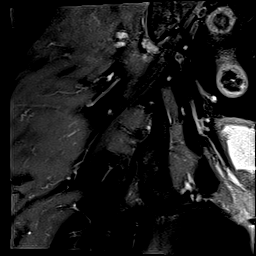
[im 29/29]
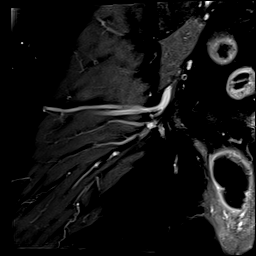

[38 of 40 positions shown; findings below may reference images not displayed]

FINDINGS: Bones: No acute fracture. No dislocation. No femoral head avascular
necrosis. Bony pelvis intact. Minimal arthropathy at the SI joints
and pubic symphysis. No evidence of sacroiliitis. No bone marrow
edema. No suspicious bone lesion.

Articular cartilage and labrum

Articular cartilage: Minimal chondral surface irregularity along the
superior acetabular chondral surface. No full-thickness cartilage
defect.

Labrum: Anterosuperior labrum appears degenerated. No well-defined
tear on non-arthrographic imaging. Again seen is a multilobulated T2
hyperintense cystic structure along the posteroinferior aspect of
the acetabulum measuring approximately 3.4 x 0.8 x 1.9 cm
(previously measured 2.7 x 0.7 x 1.4 cm).

Joint or bursal effusion

Joint effusion:  None.

Bursae: Trace right peritrochanteric bursal fluid.

Muscles and tendons

Muscles and tendons: Tendinosis and partial thickness tearing of the
right gluteus minimus greater than gluteus medius tendons. The
hamstring, iliopsoas, rectus femoris, and adductor tendons appear
intact without tear or significant tendinosis. Normal muscle bulk
and signal intensity without edema, atrophy, or fatty infiltration.

Other findings

Miscellaneous: No inguinal lymphadenopathy. No acute findings within
the visualized pelvis.
IMPRESSION: 1. Tendinosis and partial thickness tearing of the right gluteus
minimus greater than gluteus medius tendons. Trace right
peritrochanteric bursal fluid.
2. Slight interval increase in size of a multilobulated cystic
structure along the posteroinferior aspect of the acetabulum, which
may represent a paralabral cyst or ganglion. The superior labrum
appears degenerated without a well-defined tear by non-arthrographic
imaging.
3. Mild degenerative changes of the right hip. No acute osseous
findings.
